# Patient Record
Sex: Female | Born: 1935 | Race: White | Hispanic: No | Smoking: Never smoker
Health system: Southern US, Community
[De-identification: ages and names within clinical notes are randomized; demographics above are authoritative.]

## PROBLEM LIST (undated history)

## (undated) DIAGNOSIS — I10 Essential (primary) hypertension: Secondary | ICD-10-CM

## (undated) DIAGNOSIS — I6529 Occlusion and stenosis of unspecified carotid artery: Secondary | ICD-10-CM

## (undated) DIAGNOSIS — F0390 Unspecified dementia without behavioral disturbance: Secondary | ICD-10-CM

## (undated) DIAGNOSIS — I739 Peripheral vascular disease, unspecified: Secondary | ICD-10-CM

## (undated) DIAGNOSIS — M1990 Unspecified osteoarthritis, unspecified site: Secondary | ICD-10-CM

## (undated) DIAGNOSIS — K219 Gastro-esophageal reflux disease without esophagitis: Secondary | ICD-10-CM

## (undated) DIAGNOSIS — N183 Chronic kidney disease, stage 3 unspecified: Secondary | ICD-10-CM

## (undated) DIAGNOSIS — E785 Hyperlipidemia, unspecified: Secondary | ICD-10-CM

## (undated) HISTORY — DX: Hyperlipidemia, unspecified: E78.5

## (undated) HISTORY — DX: Peripheral vascular disease, unspecified: I73.9

## (undated) HISTORY — DX: Unspecified osteoarthritis, unspecified site: M19.90

## (undated) HISTORY — PX: TOTAL KNEE ARTHROPLASTY: SHX125

## (undated) HISTORY — DX: Occlusion and stenosis of unspecified carotid artery: I65.29

## (undated) HISTORY — PX: KNEE SURGERY: SHX244

## (undated) HISTORY — PX: CAROTID ENDARTERECTOMY: SUR193

## (undated) HISTORY — DX: Gastro-esophageal reflux disease without esophagitis: K21.9

---

## 2001-05-24 HISTORY — PX: JOINT REPLACEMENT: SHX530

## 2001-11-14 ENCOUNTER — Encounter: Payer: Self-pay | Admitting: Orthopedic Surgery

## 2001-11-22 ENCOUNTER — Inpatient Hospital Stay (HOSPITAL_COMMUNITY): Admission: RE | Admit: 2001-11-22 | Discharge: 2001-11-27 | Payer: Self-pay | Admitting: Orthopedic Surgery

## 2001-11-22 ENCOUNTER — Encounter: Payer: Self-pay | Admitting: Orthopedic Surgery

## 2003-12-17 ENCOUNTER — Encounter: Admission: RE | Admit: 2003-12-17 | Discharge: 2003-12-17 | Payer: Self-pay | Admitting: Family Medicine

## 2004-10-21 ENCOUNTER — Encounter: Admission: RE | Admit: 2004-10-21 | Discharge: 2004-10-21 | Payer: Self-pay | Admitting: Family Medicine

## 2004-12-23 ENCOUNTER — Ambulatory Visit (HOSPITAL_COMMUNITY): Admission: RE | Admit: 2004-12-23 | Discharge: 2004-12-23 | Payer: Self-pay | Admitting: Family Medicine

## 2005-03-24 ENCOUNTER — Encounter (INDEPENDENT_AMBULATORY_CARE_PROVIDER_SITE_OTHER): Payer: Self-pay | Admitting: Specialist

## 2005-03-24 ENCOUNTER — Ambulatory Visit (HOSPITAL_COMMUNITY): Admission: RE | Admit: 2005-03-24 | Discharge: 2005-03-24 | Payer: Self-pay | Admitting: Obstetrics and Gynecology

## 2005-12-28 ENCOUNTER — Encounter: Admission: RE | Admit: 2005-12-28 | Discharge: 2005-12-28 | Payer: Self-pay | Admitting: Family Medicine

## 2007-01-02 ENCOUNTER — Encounter: Admission: RE | Admit: 2007-01-02 | Discharge: 2007-01-02 | Payer: Self-pay | Admitting: Family Medicine

## 2008-03-06 ENCOUNTER — Encounter: Admission: RE | Admit: 2008-03-06 | Discharge: 2008-03-06 | Payer: Self-pay | Admitting: Family Medicine

## 2010-08-24 ENCOUNTER — Emergency Department (HOSPITAL_COMMUNITY): Payer: Medicare Other

## 2010-08-24 ENCOUNTER — Inpatient Hospital Stay (HOSPITAL_COMMUNITY)
Admission: EM | Admit: 2010-08-24 | Discharge: 2010-08-26 | DRG: 312 | Disposition: A | Discharge status: Home / Self Care | Payer: Medicare Other | Attending: Internal Medicine | Admitting: Internal Medicine

## 2010-08-24 DIAGNOSIS — I059 Rheumatic mitral valve disease, unspecified: Secondary | ICD-10-CM | POA: Diagnosis present

## 2010-08-24 DIAGNOSIS — R55 Syncope and collapse: Principal | ICD-10-CM | POA: Diagnosis present

## 2010-08-24 DIAGNOSIS — Z791 Long term (current) use of non-steroidal anti-inflammatories (NSAID): Secondary | ICD-10-CM

## 2010-08-24 DIAGNOSIS — M810 Age-related osteoporosis without current pathological fracture: Secondary | ICD-10-CM | POA: Diagnosis present

## 2010-08-24 DIAGNOSIS — Z79899 Other long term (current) drug therapy: Secondary | ICD-10-CM

## 2010-08-24 DIAGNOSIS — E785 Hyperlipidemia, unspecified: Secondary | ICD-10-CM | POA: Diagnosis present

## 2010-08-24 DIAGNOSIS — Z96659 Presence of unspecified artificial knee joint: Secondary | ICD-10-CM

## 2010-08-24 DIAGNOSIS — Z7982 Long term (current) use of aspirin: Secondary | ICD-10-CM

## 2010-08-24 DIAGNOSIS — I6529 Occlusion and stenosis of unspecified carotid artery: Secondary | ICD-10-CM | POA: Diagnosis present

## 2010-08-24 DIAGNOSIS — I10 Essential (primary) hypertension: Secondary | ICD-10-CM | POA: Diagnosis present

## 2010-08-24 DIAGNOSIS — I658 Occlusion and stenosis of other precerebral arteries: Secondary | ICD-10-CM | POA: Diagnosis present

## 2010-08-24 HISTORY — DX: Essential (primary) hypertension: I10

## 2010-08-24 LAB — URINALYSIS, ROUTINE W REFLEX MICROSCOPIC
Glucose, UA: NEGATIVE mg/dL
Hgb urine dipstick: NEGATIVE
Ketones, ur: NEGATIVE mg/dL
Urobilinogen, UA: 0.2 mg/dL (ref 0.0–1.0)
pH: 5 (ref 5.0–8.0)

## 2010-08-24 LAB — BASIC METABOLIC PANEL
BUN: 20 mg/dL (ref 6–23)
Glucose, Bld: 127 mg/dL — ABNORMAL HIGH (ref 70–99)
Potassium: 4 mEq/L (ref 3.5–5.1)
Sodium: 138 mEq/L (ref 135–145)

## 2010-08-24 LAB — CBC
Hemoglobin: 13 g/dL (ref 12.0–15.0)
MCHC: 35.2 g/dL (ref 30.0–36.0)
RDW: 11.7 % (ref 11.5–15.5)
WBC: 9 10*3/uL (ref 4.0–10.5)

## 2010-08-24 LAB — POCT CARDIAC MARKERS
CKMB, poc: <1 ng/mL — ABNORMAL LOW (ref 1.0–8.0)
Troponin i, poc: <0.05 ng/mL (ref 0.00–0.09)

## 2010-08-24 LAB — DIFFERENTIAL
Basophils Relative: 0 % (ref 0–1)
Eosinophils Absolute: 0.3 10*3/uL (ref 0.0–0.7)
Lymphocytes Relative: 21 % (ref 12–46)
Monocytes Absolute: 0.4 10*3/uL (ref 0.1–1.0)
Neutro Abs: 6.4 10*3/uL (ref 1.7–7.7)
Neutrophils Relative %: 71 % (ref 43–77)

## 2010-08-24 LAB — URINE MICROSCOPIC-ADD ON

## 2010-08-25 ENCOUNTER — Encounter (HOSPITAL_COMMUNITY): Payer: Self-pay | Admitting: Radiology

## 2010-08-25 ENCOUNTER — Observation Stay (HOSPITAL_COMMUNITY): Payer: Medicare Other

## 2010-08-25 LAB — COMPREHENSIVE METABOLIC PANEL
ALT: 11 U/L (ref 0–35)
Albumin: 3.3 g/dL — ABNORMAL LOW (ref 3.5–5.2)
BUN: 16 mg/dL (ref 6–23)
Creatinine, Ser: 0.88 mg/dL (ref 0.4–1.2)
GFR calc Af Amer: >60 mL/min (ref >60)
GFR calc non Af Amer: >60 mL/min (ref >60)
Total Bilirubin: 0.4 mg/dL (ref 0.3–1.2)

## 2010-08-25 LAB — CBC
HCT: 36 % (ref 36.0–46.0)
RDW: 11.7 % (ref 11.5–15.5)

## 2010-08-25 LAB — CARDIAC PANEL(CRET KIN+CKTOT+MB+TROPI)
Relative Index: INVALID (ref 0.0–2.5)
Total CK: 39 U/L (ref 7–177)
Troponin I: 0.01 ng/mL (ref 0.00–0.06)
Troponin I: <0.01 ng/mL (ref 0.00–0.06)

## 2010-08-25 LAB — TSH: TSH: 1.188 u[IU]/mL (ref 0.350–4.500)

## 2010-08-25 LAB — URINE CULTURE: Colony Count: NO GROWTH

## 2010-08-25 LAB — CK TOTAL AND CKMB (NOT AT ARMC): Total CK: 42 U/L (ref 7–177)

## 2010-08-25 MED ORDER — IOHEXOL 350 MG/ML SOLN
50.0000 mL | Freq: Once | INTRAVENOUS | Status: AC | PRN
  Administered 2010-08-25: 50 mL via INTRAVENOUS

## 2010-08-26 DIAGNOSIS — I63239 Cerebral infarction due to unspecified occlusion or stenosis of unspecified carotid arteries: Secondary | ICD-10-CM

## 2010-08-26 LAB — CBC
Hemoglobin: 11.5 g/dL — ABNORMAL LOW (ref 12.0–15.0)
MCH: 30.5 pg (ref 26.0–34.0)
MCHC: 34.2 g/dL (ref 30.0–36.0)
MCV: 89.1 fL (ref 78.0–100.0)
WBC: 7 10*3/uL (ref 4.0–10.5)

## 2010-08-26 LAB — BASIC METABOLIC PANEL
BUN: 13 mg/dL (ref 6–23)
Chloride: 105 mEq/L (ref 96–112)
GFR calc non Af Amer: >60 mL/min (ref >60)
Glucose, Bld: 108 mg/dL — ABNORMAL HIGH (ref 70–99)

## 2010-08-30 NOTE — Discharge Summary (Signed)
  [name], [name]              ACCOUNT NO.:  [id number]  MEDICAL RECORD NO.:  [id number]           PATIENT TYPE:  O  LOCATION:  2020                         FACILITY:  [facility]  PHYSICIAN:  Lonia Blood, M.D.       DATE OF BIRTH:  25-Jun-1932  DATE OF ADMISSION:  08/24/2010 DATE OF DISCHARGE:  08/26/2010                              DISCHARGE SUMMARY   PRIMARY CARE PHYSICIAN:  Marjory Lies, MD  DISCHARGE DIAGNOSES: 1. Syncope of unclear etiology. 2. Severe bilateral carotid artery stenosis. 3. Hyperlipidemia. 4. Hypertension. 5. Osteoporosis. 6. History of right knee replacement.  DISCHARGE MEDICATIONS: 1. Tylenol 650 mg by mouth every 4 hours as needed for pain. 2. Fosamax 70 mg weekly. 3. Aspirin 325 mg daily. 4. Benadryl 1-2 at bedtime as needed for sleep. 5. Calcium with vitamin D a tablet twice a day. 6. Diovan/hydrochlorothiazide 320/12.5 daily. 7. Metoprolol 100 mg daily. 8. Nexium 40 mg daily. 9. Simvastatin 40 mg at bedtime.  CONDITION ON DISCHARGE:  [name] was discharged in good condition, neurologically intact.  She is scheduled to follow up on September 02, 2010, for elective carotid endarterectomy.  CONSULTATION THIS ADMISSION:  The patient was in consultation by Dr. Josephina Gip.  PROCEDURE THIS ADMISSION: 1. The patient underwent MRI of the brain without contrast showing no     acute infarct, nonspecific white matter type changes. 2. June 26, 2010, CT angiogram of the neck showing bilateral 86%     carotid artery stenosis. 3. Echocardiogram, August 25, 2010, showing an ejection fraction 55% to     60%.  No regional wall motion abnormalities, mild mitral valve     regurgitation, minimal increase in the right ventricle pressure.  HISTORY AND PHYSICAL:  Refer to dictated H and P done by Dr. Toniann Fail.  HOSPITAL COURSE:  [name] is a 77 year old woman with history of hypertension, hyperlipidemia, was admitted from the emergency room after an  episode of syncope.  Syncopal event most likely indicated a prior vasovagal event in the setting of some dehydration from over exhaustion working in the heat.  Nevertheless, she had a complete workup given her age and an MRI of the brain did not indicate presence of a stroke. Carotid ultrasound indicated that there might be significant right artery stenosis.  CT angiogram of the neck indicated 86% of bilateral carotid artery stenosis.  Given the patient's event, presence of hyperlipidemia, hypertension, we felt that this conceivable asymptomatic carotid artery stenosis needs surgical attention.  The patient was seen by Dr. Josephina Gip from Vascular Surgery and she is scheduled for carotid endarterectomy on September 02, 2010.     Lonia Blood, M.D.     SL/MEDQ  D:  08/27/2010  T:  08/28/2010  Job:  [id number]  cc:   Marjory Lies, M.D.  Electronically Signed by Lonia Blood M.D. on 08/30/2010 10:24:09 AM

## 2010-08-31 ENCOUNTER — Encounter (HOSPITAL_COMMUNITY)
Admission: RE | Admit: 2010-08-31 | Discharge: 2010-08-31 | Disposition: A | Discharge status: Home / Self Care | Payer: Medicare Other | Source: Ambulatory Visit | Attending: Vascular Surgery | Admitting: Vascular Surgery

## 2010-08-31 LAB — COMPREHENSIVE METABOLIC PANEL
ALT: 13 U/L (ref 0–35)
AST: 18 U/L (ref 0–37)
Calcium: 9.6 mg/dL (ref 8.4–10.5)
GFR calc Af Amer: >60 mL/min (ref >60)
Glucose, Bld: 97 mg/dL (ref 70–99)
Sodium: 138 mEq/L (ref 135–145)
Total Protein: 6.3 g/dL (ref 6.0–8.3)

## 2010-08-31 LAB — SURGICAL PCR SCREEN: Staphylococcus aureus: NEGATIVE

## 2010-08-31 LAB — PROTIME-INR
INR: 1.01 (ref 0.00–1.49)
Prothrombin Time: 13.5 seconds (ref 11.6–15.2)

## 2010-08-31 NOTE — Consult Note (Signed)
[name], [name]              ACCOUNT NO.:  [id number]  MEDICAL RECORD NO.:  [id number]           PATIENT TYPE:  O  LOCATION:  2020                         FACILITY:  [facility]  PHYSICIAN:  Quita Skye. Hart Rochester, M.D.  DATE OF BIRTH:  April 21, 1933  DATE OF CONSULTATION:  08/26/2010 DATE OF DISCHARGE:  08/26/2010                                CONSULTATION   CHIEF COMPLAINT:  Severe bilateral carotid occlusive disease.  HISTORY OF PRESENT ILLNESS:  This 77 year old female patient suffered a brief syncopal episode on August 24, 2010 while eating dinner with her son.  She had been out of doors much of the day and it was felt she may have been dehydrated.  She denied any hemiparesis, aphasia, amaurosis fugax, diplopia, blurred vision or other neurologic symptoms either prior to that time or at the same time and had no history of previous stroke.  Following this episode, she was evaluated at the hospital and found to be neurologically intact.  CHRONIC MEDICAL PROBLEMS: 1. Hypertension. 2. Hyperlipidemia. 3. History of arthritis with previous right total knee replacement and     left knee arthroscopy. 4. Negative for coronary artery disease, diabetes, COPD, or stroke.  SOCIAL HISTORY:  Negative for tobacco, negative for alcohol, and negative for drug use.  FAMILY HISTORY:  Positive for coronary artery disease in her mother, diabetes in her brother, and stroke in her brother.  REVIEW OF SYSTEMS:  She denies any chest pain, dyspnea on exertion. Does not ambulate long distances, uses a cane because of her knees and arthritis.  All other systems are negative with the exception of her joint discomfort in the back and the knees.  PHYSICAL EXAMINATION:  VITAL SIGNS:  Blood vessel blood pressure is 150/70, heart rate 67, and respirations 14. GENERAL:  She is an elderly female who is in no apparent distress, alert, and oriented x3. HEENT:  Normal for age.  EOMs intact. CHEST:  Clear to  auscultation.  No rhonchi or wheezing. CARDIOVASCULAR:  Regular rhythm.  No murmurs.  Carotid pulses are 3+. ABDOMEN:  Soft, nontender, and no masses. EXTREMITIES:  Lower extremity exam reveals 3+ femoral, popliteal, and dorsalis pedis pulses palpable. NEUROLOGIC:  Normal. MUSCULOSKELETAL:  Right total knee replacement.  No obvious abnormalities. SKIN:  Free of rashes.  IMAGING:  I reviewed her CT angiogram which was done during this hospitalization and also her carotid duplex exam.  CT angiogram suggests 80-90% left internal carotid stenosis and 80% right internal carotid stenosis and this is supported by her duplex scan with no evidence of stroke.  IMPRESSION:  Severe bilateral internal carotid stenoses with recent syncopal episode.  PLAN:  I believe the patient should have staged bilateral carotid endarterectomies, although I suspect that these did not cause the symptoms although this is unclear.  Plan will be to admit the patient on September 02, 2010 for an elective left carotid endarterectomy.  Risks and benefits have been thoroughly discussed.     Quita Skye Hart Rochester, M.D.     JDL/MEDQ  D:  08/26/2010  T:  08/27/2010  Job:  [id number]  Electronically Signed by Lyndee Leo.D.  on 08/31/2010 10:21:36 AM

## 2010-09-02 ENCOUNTER — Inpatient Hospital Stay (HOSPITAL_COMMUNITY)
Admission: RE | Admit: 2010-09-02 | Discharge: 2010-09-03 | DRG: 039 | Disposition: A | Discharge status: Home / Self Care | Payer: Medicare Other | Source: Ambulatory Visit | Attending: Vascular Surgery | Admitting: Vascular Surgery

## 2010-09-02 ENCOUNTER — Other Ambulatory Visit: Payer: Self-pay | Admitting: Vascular Surgery

## 2010-09-02 DIAGNOSIS — M1710 Unilateral primary osteoarthritis, unspecified knee: Secondary | ICD-10-CM | POA: Diagnosis present

## 2010-09-02 DIAGNOSIS — I6529 Occlusion and stenosis of unspecified carotid artery: Principal | ICD-10-CM | POA: Diagnosis present

## 2010-09-02 DIAGNOSIS — Z96659 Presence of unspecified artificial knee joint: Secondary | ICD-10-CM

## 2010-09-02 DIAGNOSIS — Z01810 Encounter for preprocedural cardiovascular examination: Secondary | ICD-10-CM

## 2010-09-02 DIAGNOSIS — Z01812 Encounter for preprocedural laboratory examination: Secondary | ICD-10-CM

## 2010-09-02 DIAGNOSIS — IMO0002 Reserved for concepts with insufficient information to code with codable children: Secondary | ICD-10-CM | POA: Diagnosis present

## 2010-09-02 DIAGNOSIS — I10 Essential (primary) hypertension: Secondary | ICD-10-CM | POA: Diagnosis present

## 2010-09-02 DIAGNOSIS — E785 Hyperlipidemia, unspecified: Secondary | ICD-10-CM | POA: Diagnosis present

## 2010-09-02 LAB — CBC
MCH: 31.3 pg (ref 26.0–34.0)
MCHC: 35.1 g/dL (ref 30.0–36.0)
MCV: 89.3 fL (ref 78.0–100.0)
Platelets: 219 10*3/uL (ref 150–400)
RDW: 11.8 % (ref 11.5–15.5)
WBC: 8.3 10*3/uL (ref 4.0–10.5)

## 2010-09-02 LAB — URINALYSIS, ROUTINE W REFLEX MICROSCOPIC
Bilirubin Urine: NEGATIVE
Ketones, ur: NEGATIVE mg/dL
Nitrite: NEGATIVE
Protein, ur: NEGATIVE mg/dL
Specific Gravity, Urine: 1.022 (ref 1.005–1.030)
Urobilinogen, UA: 0.2 mg/dL (ref 0.0–1.0)

## 2010-09-03 LAB — CBC
HCT: 33 % — ABNORMAL LOW (ref 36.0–46.0)
Hemoglobin: 11.3 g/dL — ABNORMAL LOW (ref 12.0–15.0)
MCH: 30.8 pg (ref 26.0–34.0)
MCV: 89.9 fL (ref 78.0–100.0)
RBC: 3.67 MIL/uL — ABNORMAL LOW (ref 3.87–5.11)
WBC: 8.8 10*3/uL (ref 4.0–10.5)

## 2010-09-03 LAB — BASIC METABOLIC PANEL
BUN: 10 mg/dL (ref 6–23)
CO2: 29 mEq/L (ref 19–32)
Chloride: 101 mEq/L (ref 96–112)
Creatinine, Ser: 0.75 mg/dL (ref 0.4–1.2)
Potassium: 3.6 mEq/L (ref 3.5–5.1)

## 2010-09-03 LAB — GLUCOSE, CAPILLARY: Glucose-Capillary: 134 mg/dL — ABNORMAL HIGH (ref 70–99)

## 2010-09-04 LAB — TYPE AND SCREEN
ABO/RH(D): A POS
Unit division: 0

## 2010-09-08 NOTE — Discharge Summary (Signed)
NAMEINDRA, WOLTERS              ACCOUNT NO.:  [id number]  MEDICAL RECORD NO.:  [id number]           PATIENT TYPE:  I  LOCATION:  3301                         FACILITY:  [facility]  PHYSICIAN:  Quita Skye. Hart Rochester, M.D.  DATE OF BIRTH:  January 09, 1933  DATE OF ADMISSION:  09/02/2010 DATE OF DISCHARGE:  09/03/2010                              DISCHARGE SUMMARY   FINAL DIAGNOSIS:  Left internal carotid artery stenosis.  PAST MEDICAL HISTORY AND DISCHARGE DIAGNOSES: 1. Hypertension. 2. Hyperlipidemia. 3. Arthritis with previous right total knee replacement and left knee     arthroscopy. 4. Left internal carotid artery stenosis status post left carotid     endarterectomy.  BRIEF HISTORY:  The patient is a 77 year old female who was admitted on August 24, 2010, after suffering a presyncopal episode at home.  Workup included a CT angiogram which revealed a left internal carotid artery stenosis of 80% to 90% and a right internal carotid artery stenosis of 80%.  Dr. Hart Rochester was consulted regarding these results and he felt that the patient should undergo staged bilateral carotid endarterectomies for stroke risk reduction.  HOSPITAL COURSE:  The patient was admitted and taken to the OR on September 02, 2010 for a left carotid endarterectomy with Dacron patch angioplasty.  The patient tolerated the procedure well and was hemodynamically stable immediately postoperatively.  She was transferred from the OR to the Post Anesthesia Care Unit in stable condition.  She was extubated without complication and woke up from anesthesia neurologically intact.  The patient's postoperative course progressed as expected.  On postoperative day #1, she was without complaint.  She was voiding and ambulating without difficulty.  She was also tolerating a regular diet.  PHYSICAL EXAMINATION:  CARDIAC:  Regular rate and rhythm. LUNGS:  Clear to auscultation. ABDOMEN:  Benign.  INCISION: clean, dry, and intact with  no evidence of hematoma. NEUROLOGIC:  She is neurologically intact.  The patient is doing well status post carotid endarterectomy and is felt stable for discharge home at this time.  LABORATORY DATA:  CBC and BMP on September 03, 2010, white count 8.8, hemoglobin 11.3, hematocrit 33, platelets 165.  Sodium 136, potassium 3.6, BUN 10, creatinine 0.75.  DISCHARGE INSTRUCTIONS:  The patient received specific written discharge instructions regarding diet, activity, and wound care.  She will follow up with Dr. Hart Rochester in approximately 2 weeks after discharge.  She will be contacted with the office with the date and time of that appointment.  DISCHARGE MEDICATIONS: 1. Oxycodone 5 mg 1-2 q.4-6 h. p.r.n. pain. 2. Alendronate 70 mg 1 tablet p.o. q.week. 3. Aspirin 325 mg 1 p.o. daily. 4. Acetaminophen 325 mg 2 tablets p.o. q.4 h. p.r.n. pain. 5. Benadryl 25 mg 1-2 at bedtime p.r.n. 6. Calcium carbonate OTC 1 tablet p.o. b.i.d. 7. Diovan/HCT 320/12.5 mg one p.o. daily. 8. Metoprolol XL 100 mg p.o. daily. 9. Nexium 40 mg daily. 10.Simvastatin 40 mg at bedtime.     Pecola Leisure, PA   ______________________________ Quita Skye Hart Rochester, M.D.    AY/MEDQ  D:  09/03/2010  T:  09/04/2010  Job:  [id number]  Electronically Signed by Marchelle Folks  [facility] PA on 09/08/2010 10:49:44 AM Electronically Signed by Josephina Gip M.D. on 09/08/2010 11:51:04 AM

## 2010-09-08 NOTE — Op Note (Signed)
NAMEADEJA, SARRATT              ACCOUNT NO.:  [id number]  MEDICAL RECORD NO.:  [id number]          PATIENT TYPE:  LOCATION:                                 FACILITY:  PHYSICIAN:  Quita Skye. Hart Rochester, M.D.       DATE OF BIRTH:  DATE OF PROCEDURE:  09/02/2010 DATE OF DISCHARGE:                              OPERATIVE REPORT   PREOPERATIVE DIAGNOSIS:  Bilateral significant carotid occlusive disease, left greater than right with recent syncopal episode.  POSTOPERATIVE DIAGNOSIS:  Bilateral significant carotid occlusive disease, left greater than right with recent syncopal episode.  OPERATIONS:  Left carotid endarterectomy with Dacron patch angioplasty.  SURGEON:  Quita Skye. Hart Rochester, MD  FIRST ASSISTANT:  Janetta Hora. Fields, MD  SECOND ASSISTANT:  Lujean Rave PA  ANESTHESIA:  General endotracheal.  BRIEF HISTORY:  This 77 year old female had a near syncopal episode while eating lunch with her son a few weeks ago.  She then became totally neurologically intact and had no evidence of stroke.  Workup included carotid duplex and CT angiography which revealed significant bilateral internal carotid stenoses, left greater than right, both in the 89% range.  She was scheduled for staged bilateral carotid endarterectomies.  PROCEDURE:  The patient was taken to the operating room, placed in the supine position at which time satisfactory general endotracheal anesthesia was administered.  The left neck was prepped with Betadine scrub solution and draped in a routine sterile manner.  Incision was made along the anterior border of the sternocleidomastoid muscle and carried down through the subcutaneous tissue and platysma using Bovie. Common facial vein and external jugular veins were ligated with 3-0 silk ties and divided exposing the common internal and external carotid arteries.  Care was taken not to injure the vagus or hypoglossal nerves both which were exposed.  There was calcified  atherosclerotic plaque at the carotid bifurcation extending up the internal carotid about 3 cm distal vessel appeared normal.  A #10 shunt was prepared and the patient was heparinized.  Carotid vessels were occluded with vascular clamps. Longitudinal opening made in the common carotid with a 15-blade extended up into the internal carotid with Potts scissors to a point distal to the disease.  Plaque was about 80-90% stenotic in severity, distal vessel appeared normal.  A #10 shunt was inserted without difficulty reestablishing flow in about 2 minutes.  Standard endarterectomy was then performed using elevator and Potts scissors with eversion endarterectomy of the external carotid.  The plaque feathered off the distal internal carotid artery nicely not requiring any tacking sutures. Lumen was thoroughly irrigated with heparin saline.  All loose debris carefully removed, arteriotomy was closed with a patch using continuous 6-0 Prolene.  Prior to completion of the closure, the shunt was removed after about 30 minutes of shunt time following antegrade and retrograde flushing, closure was completed through reestablishing of flow initially up the external and up the internal branch.  Carotid was occluded for less than 2 minutes for removal of the shunt.  Protamine was then given to reverse the heparin, following adequate hemostasis, wound was irrigated with saline, closed in layers with Vicryl in  subcuticular fashion.  Sterile dressing applied.  The patient was taken to the recovery room in satisfactory condition.     Quita Skye Hart Rochester, M.D.     JDL/MEDQ  D:  09/02/2010  T:  09/03/2010  Job:  [id number]  Electronically Signed by Josephina Gip M.D. on 09/08/2010 11:51:01 AM

## 2010-09-15 ENCOUNTER — Ambulatory Visit (INDEPENDENT_AMBULATORY_CARE_PROVIDER_SITE_OTHER): Payer: Medicare Other | Admitting: Vascular Surgery

## 2010-09-15 DIAGNOSIS — I6529 Occlusion and stenosis of unspecified carotid artery: Secondary | ICD-10-CM

## 2010-09-16 NOTE — Assessment & Plan Note (Signed)
OFFICE VISIT  JERUSALEM, BROWNSTEIN DOB:  06-Jul-1932                                       09/15/2010 ZOXWR#:[id number]  Patient returns for initial follow-up regarding her left carotid endarterectomy, which I performed April 11 for severe left internal carotid stenosis which was preceded by a syncopal episode.  She has done well since her surgery with no neurologic complications.  She denies any hemiparesis, aphasia, amaurosis fugax, diplopia, blurred vision, or other syncopal episodes.  She is swallowing well and has no hoarseness. She is taking 1 aspirin a day.  On physical exam today, blood pressure 152/61, heart rate 58, respirations 24.  Her left neck incision has healed nicely.  She has a 3+ carotid pulse with no audible bruit on the left.  There is a soft bruit on the right side.  Neurologic exam reveals a mild left marginal mandibular nerve paresis, otherwise normal.  I think she has done very well following her left carotid surgery.  We will schedule her right carotid endarterectomy for Thursday May 17 at [facility].  She has an approximate 80% right internal carotid stenosis, which is asymptomatic.  She is ready to proceed with that in the near future.    Quita Skye Hart Rochester, M.D. Electronically Signed  JDL/MEDQ  D:  09/15/2010  T:  09/16/2010  Job:  [id number]

## 2010-09-30 ENCOUNTER — Encounter (HOSPITAL_COMMUNITY)
Admission: RE | Admit: 2010-09-30 | Discharge: 2010-09-30 | Disposition: A | Discharge status: Home / Self Care | Payer: Medicare Other | Source: Ambulatory Visit | Attending: Vascular Surgery | Admitting: Vascular Surgery

## 2010-09-30 ENCOUNTER — Ambulatory Visit (HOSPITAL_COMMUNITY)
Admission: RE | Admit: 2010-09-30 | Discharge: 2010-09-30 | Disposition: A | Discharge status: Home / Self Care | Payer: Medicare Other | Source: Ambulatory Visit | Attending: Vascular Surgery | Admitting: Vascular Surgery

## 2010-09-30 ENCOUNTER — Other Ambulatory Visit: Payer: Self-pay | Admitting: Vascular Surgery

## 2010-09-30 DIAGNOSIS — Z01818 Encounter for other preprocedural examination: Secondary | ICD-10-CM | POA: Insufficient documentation

## 2010-09-30 DIAGNOSIS — J4489 Other specified chronic obstructive pulmonary disease: Secondary | ICD-10-CM | POA: Insufficient documentation

## 2010-09-30 DIAGNOSIS — I6529 Occlusion and stenosis of unspecified carotid artery: Secondary | ICD-10-CM

## 2010-09-30 DIAGNOSIS — J449 Chronic obstructive pulmonary disease, unspecified: Secondary | ICD-10-CM | POA: Insufficient documentation

## 2010-09-30 DIAGNOSIS — Z01812 Encounter for preprocedural laboratory examination: Secondary | ICD-10-CM | POA: Insufficient documentation

## 2010-09-30 DIAGNOSIS — Z01810 Encounter for preprocedural cardiovascular examination: Secondary | ICD-10-CM | POA: Insufficient documentation

## 2010-09-30 DIAGNOSIS — I10 Essential (primary) hypertension: Secondary | ICD-10-CM | POA: Insufficient documentation

## 2010-09-30 LAB — COMPREHENSIVE METABOLIC PANEL
ALT: 13 U/L (ref 0–35)
Alkaline Phosphatase: 62 U/L (ref 39–117)
BUN: 17 mg/dL (ref 6–23)
CO2: 32 mEq/L (ref 19–32)
Chloride: 99 mEq/L (ref 96–112)
GFR calc non Af Amer: >60 mL/min (ref >60)
Glucose, Bld: 107 mg/dL — ABNORMAL HIGH (ref 70–99)
Potassium: 4.1 mEq/L (ref 3.5–5.1)
Sodium: 139 mEq/L (ref 135–145)
Total Bilirubin: 0.2 mg/dL — ABNORMAL LOW (ref 0.3–1.2)

## 2010-09-30 LAB — URINALYSIS, ROUTINE W REFLEX MICROSCOPIC
Glucose, UA: NEGATIVE mg/dL
Ketones, ur: NEGATIVE mg/dL
Nitrite: NEGATIVE
Protein, ur: NEGATIVE mg/dL

## 2010-09-30 LAB — SURGICAL PCR SCREEN
MRSA, PCR: NEGATIVE
Staphylococcus aureus: NEGATIVE

## 2010-09-30 LAB — PROTIME-INR: Prothrombin Time: 12.9 seconds (ref 11.6–15.2)

## 2010-09-30 LAB — CBC
Hemoglobin: 12.9 g/dL (ref 12.0–15.0)
MCH: 31 pg (ref 26.0–34.0)
MCV: 89.7 fL (ref 78.0–100.0)
Platelets: 163 10*3/uL (ref 150–400)
RBC: 4.16 MIL/uL (ref 3.87–5.11)
WBC: 8.3 10*3/uL (ref 4.0–10.5)

## 2010-09-30 LAB — URINE MICROSCOPIC-ADD ON

## 2010-09-30 LAB — APTT: aPTT: 27 seconds (ref 24–37)

## 2010-10-03 NOTE — H&P (Signed)
NAMELYANNE, KATES              ACCOUNT NO.:  [id number]  MEDICAL RECORD NO.:  [id number]           PATIENT TYPE:  E  LOCATION:  [facility]                         FACILITY:  [facility]  PHYSICIAN:  Eduard Clos, MDDATE OF BIRTH:  1932/07/22  DATE OF ADMISSION:  08/24/2010 DATE OF DISCHARGE:                             HISTORY & PHYSICAL   PRIMARY CARE PHYSICIAN:  Dr. Marjory Lies.  CHIEF COMPLAINT:  Loss of consciousness.  HISTORY OF PRESENT ILLNESS:  A 77 year old female with known history of hypertension and osteoporosis, hyperlipidemia, has been feeling weak for day or two and today while having dinner in front of her family, she suddenly fell unconscious and has generalized jerky movements.  She did not have any tongue bite or any incontinent of urine.  She became all right completely back to normal.  She has gone to ER.  In the ER, the patient had a CT head.  Labs were looking nothing acute.  The patient has been admitted for observation.  The patient denies any headache or any visual symptoms.  Denies any focal deficit.  Denies any chest pain, shortness of breath, nausea or vomiting, abdominal pain, dysuria, discharge or diarrhea.  PAST MEDICAL HISTORY:  Hypertension, hyperlipidemia, osteoporosis.  PAST SURGICAL HISTORY:  Right knee replacement.  MEDICATIONS ON ADMISSION:  Alendronate, aspirin, Diovan, ibuprofen, metoprolol, Nexium, simvastatin, doses of which has to be confirmed.  ALLERGIES:  No known drug allergies.  FAMILY HISTORY:  Significant for stroke and heart attack.  Her dad died at age 72 from heart attack.  Brother had pancreatic cancer and lung cancer.  SOCIAL HISTORY:  The patient denies smoking cigarettes, drinking alcohol or use of illegal drugs.  REVIEW OF SYSTEMS:  As per history of present illness, nothing else significant.  PHYSICAL EXAMINATION:  GENERAL:  The patient is examined at bedside not in acute distress. VITAL SIGNS:  Blood  pressure 140/78, pulse is 70 per minute, temperature 97.4, respirations 18, O2 sat 96%. HEENT/TONGUE:  Anicteric.  No pallor.  No facial asymmetry.  Tongue is midline.  No nuchal rigidity. CHEST:  Bilateral air entry is present.  No rhonchi.  No crepitation. HEART:  S1 and S2 heard. ABDOMEN:  Soft, nontender.  Bowel sounds heard. CNS: The patient is alert, awake and oriented to time, place, and person.  Moves upper and lower extremities 5/5.  There is no pronator drift.  No dysdiadochokinesia or ataxia. EXTREMITIES:  Peripheral pulses felt.  No edema.  LABORATORY FINDINGS:  EKG has been ordered.  CT of the head without contrast shows no evidence for acute intracranial hemorrhage, mass lesion or acute infarct changes of mild small vessel ischemia with a deep white matter.  Chest x-ray shows cardiomegaly, low lung volumes, and probable chronic interstitial lung disease.  CBC; WBC is 9, hemoglobin is 13, hematocrit is 36.9, platelets 193.  Basic metabolic panel; sodium 138, potassium 4, chloride 100, carbon dioxide 28, glucose 127, BUN 20, creatinine 1.1, calcium 9.5, CK-MB less than 1, troponin less than 0.05, myoglobin 103.  UA shows moderate leukocytes, squamous cells many, wbcs 11-20, hyaline casts, bacteria few.  ASSESSMENT: 1. Syncope.  2. History of hypertension. 3. History of hyperlipidemia. 4. History of hyperthyroidism. 5. Possible urinary tract infection.  PLAN: 1. At this time we will admit the patient to telemetry, rule out any     arrhythmias. 2. For syncopal episode, we will get an MRI of the brain, 2-D echo and     carotid Doppler. We will cycle cardiac markers, gently hydrate, get     orthostatics in a.m. 3. For possible UTI, at this time we will get urine culture, until     then I am going to place the patient on ceftriaxone. 4. Further recommendation as condition evolves.     Eduard Clos, MD     ANK/MEDQ  D:  08/25/2010  T:  08/25/2010  Job:   [id number]  cc:   Marjory Lies, M.D.  Electronically Signed by Midge Minium MD on 10/03/2010 07:46:21 PM

## 2010-10-08 ENCOUNTER — Inpatient Hospital Stay (HOSPITAL_COMMUNITY)
Admission: RE | Admit: 2010-10-08 | Discharge: 2010-10-09 | DRG: 039 | Disposition: A | Discharge status: Home / Self Care | Payer: Medicare Other | Source: Ambulatory Visit | Attending: Vascular Surgery | Admitting: Vascular Surgery

## 2010-10-08 ENCOUNTER — Other Ambulatory Visit: Payer: Self-pay | Admitting: Vascular Surgery

## 2010-10-08 DIAGNOSIS — K219 Gastro-esophageal reflux disease without esophagitis: Secondary | ICD-10-CM | POA: Diagnosis present

## 2010-10-08 DIAGNOSIS — E669 Obesity, unspecified: Secondary | ICD-10-CM | POA: Diagnosis present

## 2010-10-08 DIAGNOSIS — I6529 Occlusion and stenosis of unspecified carotid artery: Principal | ICD-10-CM | POA: Diagnosis present

## 2010-10-08 DIAGNOSIS — I10 Essential (primary) hypertension: Secondary | ICD-10-CM | POA: Diagnosis present

## 2010-10-08 DIAGNOSIS — Z01818 Encounter for other preprocedural examination: Secondary | ICD-10-CM

## 2010-10-08 DIAGNOSIS — Z96659 Presence of unspecified artificial knee joint: Secondary | ICD-10-CM

## 2010-10-08 DIAGNOSIS — Z01812 Encounter for preprocedural laboratory examination: Secondary | ICD-10-CM

## 2010-10-08 DIAGNOSIS — E785 Hyperlipidemia, unspecified: Secondary | ICD-10-CM | POA: Diagnosis present

## 2010-10-08 DIAGNOSIS — Z01810 Encounter for preprocedural cardiovascular examination: Secondary | ICD-10-CM

## 2010-10-08 LAB — TYPE AND SCREEN

## 2010-10-09 LAB — BASIC METABOLIC PANEL
BUN: 11 mg/dL (ref 6–23)
CO2: 27 mEq/L (ref 19–32)
Calcium: 8.6 mg/dL (ref 8.4–10.5)
Chloride: 102 mEq/L (ref 96–112)
Creatinine, Ser: 0.7 mg/dL (ref 0.4–1.2)
GFR calc Af Amer: >60 mL/min (ref >60)
Glucose, Bld: 136 mg/dL — ABNORMAL HIGH (ref 70–99)

## 2010-10-09 LAB — CBC
HCT: 31.2 % — ABNORMAL LOW (ref 36.0–46.0)
Hemoglobin: 10.6 g/dL — ABNORMAL LOW (ref 12.0–15.0)
MCH: 30.5 pg (ref 26.0–34.0)
MCHC: 34 g/dL (ref 30.0–36.0)
MCV: 89.9 fL (ref 78.0–100.0)
RBC: 3.47 MIL/uL — ABNORMAL LOW (ref 3.87–5.11)

## 2010-10-09 NOTE — Discharge Summary (Signed)
[facility]  Patient:    [name], [name] Visit Number: [id number] MRN: [id number]          Service Type: SUR Location: 4W [id number] 02 Attending Physician:  Skip Mayer Dictated by:   Clarene Reamer, P.A.-C Admit Date:  11/22/2001 Discharge Date: 11/27/2001                             Discharge Summary  ADMISSION DIAGNOSES: 1. Joint pain, right leg. 2. Osteoarthritis, right leg. 3. Knee deformity. 4. Hypertension. 5. Esophageal reflux. 6. Anemia.  DISCHARGE DIAGNOSES: 1. Joint pain, right leg. 2. Osteoarthritis, right leg. 3. Status post right total knee replacement. 4. Knee deformity. 5. Hypertension. 6. Esophageal reflux. 7. Anemia.  PROCEDURE:  The patient was taken to the operating room on 11/22/01, and underwent a right total knee replacement.  Surgeon was Dr. Ranee Gosselin. Surgery was done under general anesthesia.  Hemovac drain x1 was placed at the time of surgery.  CONSULTATIONS: 1. Rehabilitation. 2. Physical therapy. 3. Occupational therapy.  HISTORY OF PRESENT ILLNESS:  The patient is a 77 year old with long-standing history of right knee pain.  Her pain has progressively gotten worse through the years, and she has also developed a deformity of her right knee.  She states that she has gotten to the point where she is unable to perform activities of daily living.  She also goes on to state that her pain keeps her up at night.  She has elected to proceed with right total knee replacement.  LABORATORY DATA:  CBC on admission was all within normal limits.  Serial hemoglobins and hematocrits were followed throughout hospital course. Hemoglobin and hematocrit did decline to 8.4 and 24.2 on 11/26/01, at the time of discharge, however, it had risen back up to 9.1 and 25.9, respectively. Differential on admission was all within normal limits.  Coagulation studies on admission were all within normal limits.  At the time of  discharge, PT was 21.8, INR was 2.2 on Coumadin therapy.  Routine chemistries on admission was all within normal limits except a glucose was slightly high at 121. Urinalysis on admission showed a moderate amount of hemoglobin, moderate leukocyte esterase, and a few epithelial cells.  The patient has a blood type of A+ with antibody screen negative.  EKG showed normal sinus rhythm.  Preoperative films on 11/14/01, of the right knee showed impression of degenerative changes, no acute abnormality, flexion deformity of the right lower extremity, advanced degenerative disease in the medial compartment with lesser degenerative changes of the patellofemoral and lateral compartments.  Postoperative film on 11/22/01, revealed status post right total knee replacement, no acute abnormality.  HOSPITAL COURSE:  The patient was admitted to [facility] and taken to the OR.  She underwent the above stated procedure without any complications.  The patient tolerated the procedure well, was allowed to return to the recovery room and then to the orthopedic floor for continued postoperative care.  Hemovac drain was placed at the time of surgery, was pulled on postoperative day #1.  She was placed on PCA analgesia for pain control.  PCA was discontinued on postoperative day #2, when she weaned over to p.o. analgesics.  On postoperative day #1, the patient was doing well, had no complaints.  On postoperative day #2, 11/24/01, had no complaints.  Initial physical therapy progress.  On postoperative day #3, 11/25/01, she was doing well.  Pain was controlled, she had  no complaints.  On postoperative day #4, 11/26/01, the patient was doing well, pain was well controlled, ambulated 120 feet.  On 11/27/01, postoperative day #5, the patient was doing well in the a.m., had no complaints, she was ready to be discharged home.  DISPOSITION:  The patient was discharged home on 11/27/01.  DISCHARGE MEDICATIONS: 1. Coumadin  5 mg per pharmacy protocol. 2. Percocet 5/325 mg one or two p.o. q.4-6h. p.r.n. 3. Robaxin 500 mg one p.o. q.8h. p.r.n. 4. Trinsicon caplets one p.o. b.i.d.  DIET:  As tolerated.  ACTIVITY:  Hip precautions.  [facility] for home care.  Weightbearing as tolerated.  FOLLOWUP:  The patient was to follow up with Dr. Darrelyn Hillock two weeks from date of surgery.  Call [phone] for an appointment.  CONDITION ON DISCHARGE:  Stable and improved. Dictated by:   Clarene Reamer, P.A.-C Attending Physician:  Skip Mayer DD:  12/06/01 TD:  12/10/01 Job: [id number]

## 2010-10-09 NOTE — Op Note (Signed)
[facility]  Patient:    KIA, VARNADORE Visit Number: [id number] MRN: [id number]          Service Type: SUR Location: [id number] Attending Physician:  Skip Mayer Dictated by:   Georges Lynch Darrelyn Hillock, M.D. Proc. Date: 11/22/01 Admit Date:  11/22/2001                             Operative Report  PREOPERATIVE DIAGNOSIS:  Severe degenerative arthritis with a varus deformity of the right knee.  POSTOPERATIVE DIAGNOSIS:  Severe degenerative arthritis with a varus deformity of the right knee.  OPERATION:  Right total knee arthroplasty, utilizing the Osteonics System. All three components were cemented. I utilized Vancomycin cement, 2 grams of Vancomycin were mixed with cement.  The tibial tray was a size 7.  The insert was a 10 mm thickness insert.  The patella was a size 26 and the femoral component was a size 7, right.  SURGEON:  Georges Lynch. Gioffre, M.D.  ASSISTANT:  Philips J. Montez Morita, M.D.  DESCRIPTION OF PROCEDURE:  Under spinal anesthesia, routine orthopedic prepping and draping of the right lower extremity was carried out.  Incision was made down the anterior aspect of the right knee.  Bleeders were identified and cauterized.  Two flaps were created and sutured in place.  I then carried out a median parapatellar incision.  The patella was reflexed laterally.  I flexed the knee and did excise the medial and lateral menisci.  I also excised the anterior and posterior cruciate ligaments.  Following this, I went ahead and removed all the spurs.  My initial drill hole was made in the intercondylar notch of the femur.  The femoral one jig was inserted.  I removed 12 mm thickness off the distal femur because of the flexion contracture.  Following this, I then inserted my #2 jig for a size 7 right femur and cut my anterior, posterior and chamfer cuts.  After this was completed, I then cut my tibia.  I removed 2 mm thickness off the  affected medial side of the tibia, utilizing the intramedullary guide rods.  We utilized a size 7 tray.  Following this, I went and cut my intercondylar notch cuts and my patellar groove cuts in the usual fashion.  I then went through trials with a size 7 right femur and a size 7 tray with a 10 mm thickness insert and I had excellent stability.  I thoroughly irrigated out the area.  I then cut my patella.  I removed 10 mm thickness off the articular surface of the patella.  Three drill holes were made in the patella for a size 26 patella.  I then flexed the knee and cut my keel cut out of the proximal tibial plateau.  Thoroughly water piked the knee out.  Dried the knee out and then inserted Gelfoam into the distal femur and also into the proximal tibia. Following this, I then went on and cemented all three components in simultaneously.  The size 26 patella was used.  A size 7 right femur was used and a size 7 tibial tray with a 10 mm thickness insert.  All three components were cemented.  I removed all loose pieces of cement.  I then removed my trial tibial insert and searched for cement posteriorly.  Removed all of the loose pieces of cement. Thoroughly irrigated out the area.  Dried the area out. I bone  waxed the raw bleeding bone ends.  I then inserted my 10 mm thickness tibial insert and then a size 7 tray.  I reduced the knee and had excellent function.  I then inserted my Hemovac drain and closed the knee in layers in the usual fashion.  Sterile Neosporin dressing was applied.  The patient had 1 gram IV Ancef preoperatively. Dictated by:   Georges Lynch Darrelyn Hillock, M.D. Attending Physician:  Skip Mayer DD:  11/22/01 TD:  11/25/01 Job: [id number]

## 2010-10-09 NOTE — Op Note (Signed)
[name], STREET              ACCOUNT NO.:  [id number]   MEDICAL RECORD NO.:  [id number]          PATIENT TYPE:  AMB   LOCATION:  SDC                           FACILITY:  [facility]   PHYSICIAN:  Richardean Sale, M.D.   DATE OF BIRTH:  Jun 12, 1932   DATE OF PROCEDURE:  03/24/2005  DATE OF DISCHARGE:                                 OPERATIVE REPORT   PREOPERATIVE DIAGNOSES:  1.  Postmenopausal bleeding.  2.  Endometrial fluid collection on ultrasound.   POSTOPERATIVE DIAGNOSES:  1.  Postmenopausal bleeding.  2.  Endometrial fluid collection on ultrasound.  3.  Pyometra.  4.  Stenotic cervix.   PROCEDURE:  Fractional dilatation and curettage.   SURGEON.:  Richardean Sale, M.D.   ASSISTANT:  None.   ANESTHESIA:  General.   COMPLICATIONS:  None.   ESTIMATED BLOOD LOSS:  Minimal.   SPECIMEN:  Endocervical and endometrial curettings to pathology and aerobic  and anaerobic cultures.   FINDINGS:  Stenotic cervix and purulent drainage from the uterus once the  cervix was opened, and uterine sound length of 7 cm.   INDICATIONS:  This is a 77 year old white female who had an episode of  postmenopausal bleeding and abnormal vaginal discharge, who underwent  ultrasound evaluation and was found to have a normal-appearing endometrial  stripe but a fluid collection within the uterus.  An attempt was made at  office endometrial biopsy but was unsuccessful given significant cervical  stenosis.  Therefore, the patient was taken to the operating room for  evaluation of postmenopausal bleeding and endometrial fluid.  Prior to the  procedure the risks, benefits and alternatives of the procedure were  reviewed with the patient detail.  We discussed the risks, which include but  are not limited to hemorrhage, infection, injury to the uterus which could  require surgery such as hysterectomy or repair of bowel or bladder should an  injury occur.  Also reviewed the risks of DVT and general  anesthetic-related  complications.  The patient voiced understanding of these risks, and  informed consent was obtained was obtained before proceeding to the OR.   INDICATIONS:  PROCEDURE:  The patient was taken to the operating room, where she was given a general  anesthetic.  She was then prepped and draped in the usual sterile fashion  with Betadine and a red rubber catheter was then used to drain the bladder.  Bimanual exam confirmed the presence of a small uterus, anteverted and  mobile, with no obvious masses.  Adnexa were not palpable.  On speculum exam  there was significant atrophy of the vagina and a large cystocele present.  Using a Sims retractor and the posterior weighted speculum, the cervix was  able to be visualized and was grasped with a single-tooth tenaculum.  There  was significant stenosis, and the cervix was essentially flush with the  vaginal walls.  Using the lacrimal duct dilators, the cervical os was  identified and with very gentle pressure was entered, and there was purulent  fluid on return.  The cervix was then very easily dilated.  The uterus  sounded  to 7 cm.  An endocervical curettage and an endometrial curettage was  obtained and sent to pathology.  Given the purulent material, hysteroscopy  was not performed.  After the curettage, there was no further purulent  drainage.  The single-tooth tenaculum was removed and the tenaculum site was  cauterized with silver nitrate.  All instruments were then removed from the  patient's vagina.  She was taken out of the dorsal lithotomy position and  was awakened from anesthesia and was taken to the recovery room awake and in  stable condition.  There were no complications.  All sponge, lap, needle and  instrument counts were correct x2.      Richardean Sale, M.D.  Electronically Signed     JW/MEDQ  D:  03/24/2005  T:  03/24/2005  Job:  [id number]

## 2010-10-12 NOTE — Op Note (Signed)
[name], [name]              ACCOUNT NO.:  [id number]  MEDICAL RECORD NO.:  [id number]           PATIENT TYPE:  I  LOCATION:  3310                         FACILITY:  [facility]  PHYSICIAN:  Quita Skye. Hart Rochester, M.D.  DATE OF BIRTH:  1932-09-22  DATE OF PROCEDURE:  10/08/2010 DATE OF DISCHARGE:                              OPERATIVE REPORT   PREOPERATIVE DIAGNOSIS:  Severe right internal carotid stenosis - asymptomatic.  POSTOPERATIVE DIAGNOSIS:  Severe right internal carotid stenosis - asymptomatic.  OPERATION:  Right carotid endarterectomy with Dacron patch angioplasty.  SURGEON:  Quita Skye. Hart Rochester, MD  FIRST ASSISTANT:  Newton Pigg, PA  ANESTHESIA:  General endotracheal.  BRIEF HISTORY:  This patient has previously undergone left carotid endarterectomy for a severe asymptomatic stenosis, now returns to have her right side done which also has a greater than 80% stenosis.  She remains asymptomatic.  PROCEDURE IN DETAILS:  The patient was taken to the operating room, placed in the supine position, at which time satisfactory general endotracheal anesthesia was administered.  The right neck was prepped with Betadine scrub and solution, draped in routine sterile manner. Incision was made along the anterior border of the sternocleidomastoid muscle and carried down through subcutaneous tissue and platysma using the Bovie.  The common facial vein and external jugular veins ligated with 3-0 silk ties and divided exposing the common internal and external carotid arteries.  Care was taken not to injure the vagus or hypoglossal nerves both of which were exposed.  There was calcified atherosclerotic plaque at the carotid bifurcation extending up the internal carotid artery only about 2-3 cm.  Distal vessel appeared normal.  A #10 shunt was prepared and the patient was heparinized.  Carotid vessels occluded with vascular clamps, longitudinal opening made in the common carotid with  15 blade, extended up the internal carotid with Potts scissors to a point distal to the disease.  The distal vessel appeared normal with excellent backbleeding.  A #10 shunt was inserted without difficulty reestablishing flow in about 2 minutes.  The plaque was at least 80% stenotic in severity, very calcified.  Standard endarterectomy was then performed using the elevator and Potts scissors with an eversion endarterectomy of the external carotid.  The plaque feathered off the distal internal carotid artery nicely not requiring any tacking sutures. Lumen was thoroughly irrigated with heparin saline.  All loose debris carefully removed and arteriotomy was then closed with a Dacron patch using continuous 6-0 Prolene.  Prior to completion of the closure, shunt was removed after about 30 minutes shunt time following antegrade and retrograde flushing.  Closure was completed, reestablishing flow initially up the external and internal branch.  Carotid was occluded for less than 2 minutes for removal of the shunt. Protamine was then given to reverse the heparin, following adequate hemostasis. Wound was irrigated with saline, closed in layers with Vicryl in a subcuticular fashion.  Sterile dressing applied.  The patient was taken to the recovery room in satisfactory condition.     Quita Skye Hart Rochester, M.D.     JDL/MEDQ  D:  10/08/2010  T:  10/08/2010  Job:  [id number]  Electronically Signed by Josephina Gip M.D. on 10/12/2010 10:25:52 AM

## 2010-10-12 NOTE — Discharge Summary (Signed)
  NAMEJACALYN, [name]              ACCOUNT NO.:  [id number]  MEDICAL RECORD NO.:  [id number]           PATIENT TYPE:  I  LOCATION:  3310                         FACILITY:  [facility]  PHYSICIAN:  Quita Skye. Hart Rochester, M.D.  DATE OF BIRTH:  10-25-1932  DATE OF ADMISSION:  10/08/2010 DATE OF DISCHARGE:  10/09/2010                              DISCHARGE SUMMARY   ADMISSION DIAGNOSIS:  Right internal carotid artery stenosis.  PAST MEDICAL HISTORY AND DISCHARGE DIAGNOSES: 1. Right internal carotid artery stenosis status post right carotid     endarterectomy. 2. Left internal carotid artery stenosis status post recent carotid     endarterectomy. 3. Hypertension. 4. Hyperlipidemia. 5. Arthritis with previous right total knee replacement and left knee     arthroscopy.  BRIEF HISTORY:  The patient is a 77 year old Caucasian female who recently underwent a left carotid endarterectomy for severe asymptomatic stenosis who returned for staged right carotid endarterectomy secondary to a stenosis greater than 80%.  The patient remained asymptomatic.  HOSPITAL COURSE:  The patient was admitted and taken to the OR on Oct 08, 2010 for a right carotid endarterectomy with Dacron patch angioplasty.  The patient tolerated the procedure well and was hemodynamically stable immediately postoperatively.  She was transferred from OR to Postanesthesia Care Unit in stable condition.  The patient was extubated without complication and woke up from anesthesia neurologically intact.  The patient's postoperative course progressed as expected.  She did have one episode of emesis after dinner but her nausea has resolved.  She is currently without complaint.  She has remained afebrile with stable vital signs throughout the hospital course.  PHYSICAL EXAMINATION:  CARDIAC:  Regular rate and rhythm. LUNGS:  Clear to auscultation. ABDOMEN:  Benign. SKIN:  The incision is clean, dry, and intact with no evidence  of hematoma. NEUROLOGIC:  She is neurologically intact.  The patient has been able to ambulate, void, and tolerate a regular diet without any difficulty.  She is felt stable for discharge home at this time.  LABORATORY DATA:  CBC and BMP on Oct 09, 2010, white count 7.7, hemoglobin 10.6, hematocrit 31.2, platelets 152, sodium 135, potassium 3.7, BUN 11, and creatinine 0.70.  DISCHARGE INSTRUCTIONS:  The patient received specific written discharge instructions regarding diet, activity, and wound care.  She will follow up with Dr. Hart Rochester in 2 weeks after discharge.  MEDICATIONS: 1. Oxycodone 5 mg 1-2 p.o. q.4-6 h. p.r.n. pain. 2. Alendronate 70 mg 1 tablet q. week. 3. Aspirin 325 mg daily. 4. Calcium carbonate and vitamin D OTC one p.o. b.i.d. 5. Diovan HCT 320/12.5 mg 1 p.o. daily. 6. Toprol-XL 100 mg daily. 7. Nexium 40 mg daily. 8. Simvastatin 40 mg daily.     Pecola Leisure, PA   ______________________________ Quita Skye Hart Rochester, M.D.    AY/MEDQ  D:  10/09/2010  T:  10/09/2010  Job:  [id number]  Electronically Signed by Pecola Leisure PA on 10/12/2010 09:34:52 AM Electronically Signed by Josephina Gip M.D. on 10/12/2010 10:25:54 AM

## 2010-10-20 ENCOUNTER — Ambulatory Visit (INDEPENDENT_AMBULATORY_CARE_PROVIDER_SITE_OTHER): Payer: Medicare Other | Admitting: Vascular Surgery

## 2010-10-20 DIAGNOSIS — I6529 Occlusion and stenosis of unspecified carotid artery: Secondary | ICD-10-CM

## 2010-10-21 NOTE — Assessment & Plan Note (Signed)
OFFICE VISIT  [name], [name] DOB:  [birth year]                                       10/20/2010 ZOXWR#:[id number]  Patient returns following staged bilateral carotid endarterectomies done over the last several weeks.  Her most recent side was the right side. She had her previous syncopal episode prior to her first operation. Since her second surgery, she has had no problems swallowing.  Denies any hoarseness and has had no hemispheric or nonhemispheric TIAs, amaurosis fugax, diplopia, blurred vision, or syncope.  She is taking 1 aspirin per day.  On physical exam, blood pressure 146/57, heart rate 75, respirations 20. Carotid pulses are 3+ with no audible bruits.  Her right neck incision is healing nicely with mild edema.  Neurologic exam is normal with the exception of a very mild right marginal mandibular nerve paresis with no paresis on the left side.  I reassured her regarding these findings and the fact that she was doing quite well.  She will begin to drive an automobile again, and she will return to see [place] in 6 months for follow-up carotid duplex exam at that time unless she develops any neurologic symptoms in the interim.    Quita Skye Hart Rochester, M.D. Electronically Signed  JDL/MEDQ  D:  10/20/2010  T:  10/21/2010  Job:  [id number]

## 2011-03-04 ENCOUNTER — Encounter: Payer: Self-pay | Admitting: Vascular Surgery

## 2011-03-22 ENCOUNTER — Other Ambulatory Visit: Payer: Self-pay | Admitting: Family Medicine

## 2011-03-22 DIAGNOSIS — Z1231 Encounter for screening mammogram for malignant neoplasm of breast: Secondary | ICD-10-CM

## 2011-04-19 ENCOUNTER — Ambulatory Visit
Admission: RE | Admit: 2011-04-19 | Discharge: 2011-04-19 | Disposition: A | Discharge status: Home / Self Care | Payer: Medicare Other | Source: Ambulatory Visit | Attending: Family Medicine | Admitting: Family Medicine

## 2011-04-19 ENCOUNTER — Encounter: Payer: Self-pay | Admitting: Vascular Surgery

## 2011-04-19 DIAGNOSIS — Z1231 Encounter for screening mammogram for malignant neoplasm of breast: Secondary | ICD-10-CM

## 2011-04-20 ENCOUNTER — Other Ambulatory Visit (INDEPENDENT_AMBULATORY_CARE_PROVIDER_SITE_OTHER): Payer: Medicare Other | Admitting: *Deleted

## 2011-04-20 ENCOUNTER — Ambulatory Visit (INDEPENDENT_AMBULATORY_CARE_PROVIDER_SITE_OTHER): Payer: Medicare Other | Admitting: Vascular Surgery

## 2011-04-20 ENCOUNTER — Encounter: Payer: Self-pay | Admitting: Vascular Surgery

## 2011-04-20 VITALS — BP 186/53 | HR 87 | Resp 20 | Ht 62.0 in | Wt 189.0 lb

## 2011-04-20 DIAGNOSIS — I6529 Occlusion and stenosis of unspecified carotid artery: Secondary | ICD-10-CM

## 2011-04-20 DIAGNOSIS — Z48812 Encounter for surgical aftercare following surgery on the circulatory system: Secondary | ICD-10-CM

## 2011-04-20 NOTE — Progress Notes (Signed)
Subjective:     Patient ID: [name], female   DOB: 1933-01-11, 77 y.o.   MRN: [id number]  HPI this 77 year old female returns for followup regarding her bilateral carotid endarterectomies done in April and May of 2012. She has no history of stroke, lateralizing weakness, amaurosis fugax, diplopia, blurred vision, or syncope. She has been swallowing well and has had no hoarseness. She continues to take 1 aspirin-81 mg per day.  Past Medical History  Diagnosis Date  . Hypertension   . Hyperlipidemia   . Arthritis   . GERD (gastroesophageal reflux disease)   . Carotid artery occlusion     History  Substance Use Topics  . Smoking status: Never Smoker   . Smokeless tobacco: Never Used  . Alcohol Use: No    Family History  Problem Relation Age of Onset  . Heart attack Father     No Known Allergies  Current outpatient prescriptions:acetaminophen (TYLENOL) 650 MG CR tablet, Take 650 mg by mouth every 8 (eight) hours as needed.  , Disp: , Rfl: ;  alendronate (FOSAMAX) 70 MG tablet, Take 70 mg by mouth every 7 (seven) days. Take with a full glass of water on an empty stomach. , Disp: , Rfl: ;  CALCIUM-VITAMIN D PO, Take 1 tablet by mouth 2 (two) times daily.  , Disp: , Rfl:  esomeprazole (NEXIUM) 40 MG capsule, Take 40 mg by mouth daily before breakfast.  , Disp: , Rfl: ;  metoprolol (TOPROL-XL) 100 MG 24 hr tablet, Take 100 mg by mouth daily.  , Disp: , Rfl: ;  oxycodone (OXY-IR) 5 MG capsule, Take 5 mg by mouth every 4 (four) hours as needed.  , Disp: , Rfl: ;  simvastatin (ZOCOR) 40 MG tablet, Take 40 mg by mouth at bedtime.  , Disp: , Rfl:  valsartan-hydrochlorothiazide (DIOVAN-HCT) 320-12.5 MG per tablet, Take 1 tablet by mouth daily.  , Disp: , Rfl: ;  aspirin EC 325 MG tablet, Take 325 mg by mouth daily. TAKING 81MG  DAILY , Disp: , Rfl:   BP 186/53  Pulse 87  Resp 20  Ht 5\' 2"  (1.575 m)  Wt 189 lb (85.73 kg)  BMI 34.57 kg/m2  Body mass index is 34.57  kg/(m^2).         Review of Systems she denies chest pain, dyspnea on exertion, PND, orthopnea. She does have leg discomfort with walking, bilateral swollen legs, varicose veins, and chronic sinus drainage. She denies all other symptoms in the complete review of systems     Objective:   Physical Exam pressure 186/53 heart rate 87 respirations 20 General she is well-developed well-nourished female in no apparent distress alert and oriented x3 Lungs no rhonchi or wheezing Cardiovascular regular rhythm no murmurs carotid pulses 3+ no audible bruits Abdomen obese no palpable masses Lower extremity exam reveals diffuse spider and reticular veins with 1+ edema bilaterally Neurologic exam normal  Today I ordered a carotid duplex exam which I reviewed and interpreted. She has no evidence of flow reduction in her carotid endarterectomy sites bilaterally    Assessment:     Widely patent bilateral carotid endarterectomy sites-asymptomatic    Plan:     Return in 6 months for carotid duplex exam to continue surveillance. Continue aspirin therapy

## 2011-04-26 NOTE — Procedures (Unsigned)
CAROTID DUPLEX EXAM  INDICATION:  Follow up bilateral CEA's.  HISTORY: Diabetes:  No. Cardiac:  No. Hypertension:  Yes. Smoking:  No. Previous Surgery:  Right CEA, 10/08/2010; left CEA, 09/02/2010. CV History: Amaurosis Fugax No, Paresthesias No, Hemiparesis No.                                      RIGHT             LEFT Brachial systolic pressure:         190               190 Brachial Doppler waveforms:         WNL               WNL Vertebral direction of flow:        Antegrade         Antegrade DUPLEX VELOCITIES (cm/sec) CCA peak systolic                   99                80 ECA peak systolic                   170               160 ICA peak systolic                   106               111 ICA end diastolic                   27                22 PLAQUE MORPHOLOGY:                  Heterogenous      Heterogenous PLAQUE AMOUNT:                      Mild              Mild PLAQUE LOCATION:                    ECA               ECA  IMPRESSION: 1. Widely patent bilateral carotid endarterectomies. 2. Mild external carotid artery stenosis observed bilaterally. 3. Bilateral vertebral arteries are within normal limits.  ___________________________________________ Quita Skye Hart Rochester, M.D.  LT/MEDQ  D:  04/20/2011  T:  04/20/2011  Job:  [id number]

## 2011-09-27 ENCOUNTER — Other Ambulatory Visit: Payer: Self-pay | Admitting: *Deleted

## 2011-10-13 ENCOUNTER — Other Ambulatory Visit: Payer: Self-pay | Admitting: *Deleted

## 2011-10-13 DIAGNOSIS — I6529 Occlusion and stenosis of unspecified carotid artery: Secondary | ICD-10-CM

## 2011-10-15 ENCOUNTER — Encounter: Payer: Self-pay | Admitting: Neurosurgery

## 2011-10-19 ENCOUNTER — Ambulatory Visit (INDEPENDENT_AMBULATORY_CARE_PROVIDER_SITE_OTHER): Payer: Medicare Other | Admitting: Neurosurgery

## 2011-10-19 ENCOUNTER — Other Ambulatory Visit (INDEPENDENT_AMBULATORY_CARE_PROVIDER_SITE_OTHER): Payer: Medicare Other | Admitting: *Deleted

## 2011-10-19 ENCOUNTER — Encounter: Payer: Self-pay | Admitting: Neurosurgery

## 2011-10-19 VITALS — BP 188/90 | HR 55 | Resp 16 | Ht 62.0 in | Wt 186.9 lb

## 2011-10-19 DIAGNOSIS — I6529 Occlusion and stenosis of unspecified carotid artery: Secondary | ICD-10-CM

## 2011-10-19 NOTE — Progress Notes (Signed)
[facility] HISTORY AND PHYSICAL   CC: Six-month carotid duplex status post left and right CEA one year ago Referring Physician: Hart Rochester  History of Present Illness: 78 year old female patient of Dr. Hart Rochester who is one year status post a right CEA May 2012, and left CEA April 2012. Patient reports no signs or symptoms of CVA, TIA, amaurosis or any neural deficit. Patient denies any new medical diagnoses or any recent surgeries. This is her second six-month followup since her surgery.  Past Medical History  Diagnosis Date  . Hypertension   . Hyperlipidemia   . Arthritis   . GERD (gastroesophageal reflux disease)   . Carotid artery occlusion   . Peripheral vascular disease     ROS: [x]  Positive   [ ]  Denies    General: [ ]  Weight loss, [ ]  Fever, [ ]  chills Neurologic: [ ]  Dizziness, [ ]  Blackouts, [ ]  Seizure [ ]  Stroke, [ ]  "Mini stroke", [ ]  Slurred speech, [ ]  Temporary blindness; [ ]  weakness in arms or legs, [ ]  Hoarseness Cardiac: [ ]  Chest pain/pressure, [ ]  Shortness of breath at rest [ ]  Shortness of breath with exertion, [ ]  Atrial fibrillation or irregular heartbeat Vascular: [ ]  Pain in legs with walking, [ ]  Pain in legs at rest, [ ]  Pain in legs at night,  [ ]  Non-healing ulcer, [ ]  Blood clot in vein/DVT,   Pulmonary: [ ]  Home oxygen, [ ]  Productive cough, [ ]  Coughing up blood, [ ]  Asthma,  [ ]  Wheezing Musculoskeletal:  [ ]  Arthritis, [ ]  Low back pain, [ ]  Joint pain Hematologic: [ ]  Easy Bruising, [ ]  Anemia; [ ]  Hepatitis Gastrointestinal: [ ]  Blood in stool, [ ]  Gastroesophageal Reflux/heartburn, [ ]  Trouble swallowing Urinary: [ ]  chronic Kidney disease, [ ]  on HD - [ ]  MWF or [ ]  TTHS, [ ]  Burning with urination, [ ]  Difficulty urinating Skin: [ ]  Rashes, [ ]  Wounds Psychological: [ ]  Anxiety, [ ]  Depression   Social History History  Substance Use Topics  . Smoking status: Never Smoker   . Smokeless tobacco: Never Used  .  Alcohol Use: No    Family History Family History  Problem Relation Age of Onset  . Heart attack Father   . Cancer Brother   . Cancer Son   . Cancer Brother     No Known Allergies  Current Outpatient Prescriptions  Medication Sig Dispense Refill  . acetaminophen (TYLENOL) 650 MG CR tablet Take 650 mg by mouth every 8 (eight) hours as needed.        Marland Kitchen alendronate (FOSAMAX) 70 MG tablet Take 70 mg by mouth every 7 (seven) days. Take with a full glass of water on an empty stomach.       Marland Kitchen aspirin EC 325 MG tablet Take 81 mg by mouth daily. TAKING 81MG  DAILY       . CALCIUM-VITAMIN D PO Take 1 tablet by mouth 2 (two) times daily.        Marland Kitchen esomeprazole (NEXIUM) 40 MG capsule Take 40 mg by mouth daily before breakfast.        . metoprolol (TOPROL-XL) 100 MG 24 hr tablet Take 100 mg by mouth daily.        . simvastatin (ZOCOR) 40 MG tablet Take 40 mg by mouth at bedtime.        . valsartan-hydrochlorothiazide (DIOVAN-HCT) 320-12.5 MG per tablet Take 1 tablet by mouth daily.        Marland Kitchen  oxycodone (OXY-IR) 5 MG capsule Take 5 mg by mouth every 4 (four) hours as needed.          Physical Examination  Filed Vitals:   10/19/11 1416  BP: 188/90  Pulse: 55  Resp: 16    Body mass index is 34.18 kg/(m^2).  General:  WDWN in NAD Gait: Normal HEENT: WNL Eyes: Pupils equal Pulmonary: normal non-labored breathing , without Rales, rhonchi,  wheezing Cardiac: RRR, without  Murmurs, rubs or gallops; Abdomen: soft, NT, no masses Skin: no rashes, ulcers noted  Vascular Exam Pulses: 2+ radial pulses bilaterally Carotid bruits: Carotid pulses to auscultation no bruits are heard Extremities without ischemic changes, no Gangrene , no cellulitis; no open wounds;  Musculoskeletal: no muscle wasting or atrophy   Neurologic: A&O X 3; Appropriate Affect ; SENSATION: normal; MOTOR FUNCTION:  moving all extremities equally. Speech is fluent/normal  Non-Invasive Vascular Imaging CAROTID DUPLEX  10/19/2011  Right ICA 0 - 19% stenosis Left ICA 0 - 19% stenosis   ASSESSMENT/PLAN: 78 year old female patient who is doing well one year status post bilateral CEAs one month apart. Patient knows the signs and symptoms of CVA and noticed report to the nearest emergency room should this occur. Her questions were encouraged and answered, she will followup here in one year with repeat carotid duplex and be seen in my clinic.  Lauree Chandler ANP   Clinic MD: Hart Rochester

## 2011-10-20 NOTE — Progress Notes (Signed)
Addended by: Sharee Pimple on: 10/20/2011 09:25 AM   Modules accepted: Orders

## 2011-10-25 NOTE — Procedures (Unsigned)
CAROTID DUPLEX EXAM  INDICATION:  Carotid disease.  HISTORY: Diabetes:  No. Cardiac:  No. Hypertension:  Yes. Smoking:  No. Previous Surgery:  Right carotid endarterectomy on 10/08/2010, left carotid endarterectomy on 09/02/2010. CV History:  Currently asymptomatic. Amaurosis Fugax No, Paresthesias No, Hemiparesis No                                      RIGHT             LEFT Brachial systolic pressure:         192               180 Brachial Doppler waveforms:         Normal            Normal Vertebral direction of flow:        Antegrade         Antegrade DUPLEX VELOCITIES (cm/sec) CCA peak systolic                   88                65 ECA peak systolic                   143               122 ICA peak systolic                   99                93 ICA end diastolic                   28                18 PLAQUE MORPHOLOGY:                  Heterogeneous     Heterogeneous PLAQUE AMOUNT:                      Mild              Mild PLAQUE LOCATION:                    ECA               ECA  IMPRESSION: 1. No hemodynamically significant stenosis noted in the bilateral     internal carotid arteries. 2. No significant change in bilateral Doppler velocities when compared     to the previous exam on 04/20/2011.  ___________________________________________ Quita Skye. Hart Rochester, M.D.  CH/MEDQ  D:  10/22/2011  T:  10/22/2011  Job:  [id number]

## 2012-04-11 ENCOUNTER — Emergency Department (HOSPITAL_COMMUNITY): Payer: Medicare Other

## 2012-04-11 ENCOUNTER — Emergency Department (HOSPITAL_COMMUNITY)
Admission: EM | Admit: 2012-04-11 | Discharge: 2012-04-11 | Disposition: A | Discharge status: Home / Self Care | Payer: Medicare Other | Attending: Emergency Medicine | Admitting: Emergency Medicine

## 2012-04-11 ENCOUNTER — Encounter (HOSPITAL_COMMUNITY): Payer: Self-pay | Admitting: Unknown Physician Specialty

## 2012-04-11 DIAGNOSIS — R0789 Other chest pain: Secondary | ICD-10-CM | POA: Insufficient documentation

## 2012-04-11 DIAGNOSIS — S8990XA Unspecified injury of unspecified lower leg, initial encounter: Secondary | ICD-10-CM | POA: Insufficient documentation

## 2012-04-11 DIAGNOSIS — K219 Gastro-esophageal reflux disease without esophagitis: Secondary | ICD-10-CM | POA: Insufficient documentation

## 2012-04-11 DIAGNOSIS — M542 Cervicalgia: Secondary | ICD-10-CM | POA: Insufficient documentation

## 2012-04-11 DIAGNOSIS — S99929A Unspecified injury of unspecified foot, initial encounter: Secondary | ICD-10-CM | POA: Insufficient documentation

## 2012-04-11 DIAGNOSIS — M129 Arthropathy, unspecified: Secondary | ICD-10-CM | POA: Insufficient documentation

## 2012-04-11 DIAGNOSIS — M7989 Other specified soft tissue disorders: Secondary | ICD-10-CM | POA: Insufficient documentation

## 2012-04-11 DIAGNOSIS — R269 Unspecified abnormalities of gait and mobility: Secondary | ICD-10-CM | POA: Insufficient documentation

## 2012-04-11 DIAGNOSIS — Z7982 Long term (current) use of aspirin: Secondary | ICD-10-CM | POA: Insufficient documentation

## 2012-04-11 DIAGNOSIS — S298XXA Other specified injuries of thorax, initial encounter: Secondary | ICD-10-CM | POA: Insufficient documentation

## 2012-04-11 DIAGNOSIS — Y9389 Activity, other specified: Secondary | ICD-10-CM | POA: Insufficient documentation

## 2012-04-11 DIAGNOSIS — I739 Peripheral vascular disease, unspecified: Secondary | ICD-10-CM | POA: Insufficient documentation

## 2012-04-11 DIAGNOSIS — Z79899 Other long term (current) drug therapy: Secondary | ICD-10-CM | POA: Insufficient documentation

## 2012-04-11 DIAGNOSIS — I10 Essential (primary) hypertension: Secondary | ICD-10-CM | POA: Insufficient documentation

## 2012-04-11 DIAGNOSIS — I6529 Occlusion and stenosis of unspecified carotid artery: Secondary | ICD-10-CM | POA: Insufficient documentation

## 2012-04-11 DIAGNOSIS — E785 Hyperlipidemia, unspecified: Secondary | ICD-10-CM | POA: Insufficient documentation

## 2012-04-11 DIAGNOSIS — Y92410 Unspecified street and highway as the place of occurrence of the external cause: Secondary | ICD-10-CM | POA: Insufficient documentation

## 2012-04-11 LAB — POCT I-STAT, CHEM 8
Calcium, Ion: 1.26 mmol/L (ref 1.13–1.30)
Creatinine, Ser: 0.9 mg/dL (ref 0.50–1.10)
Glucose, Bld: 128 mg/dL — ABNORMAL HIGH (ref 70–99)
HCT: 38 % (ref 36.0–46.0)
Hemoglobin: 12.9 g/dL (ref 12.0–15.0)
Potassium: 4.9 mEq/L (ref 3.5–5.1)

## 2012-04-11 MED ORDER — OXYCODONE-ACETAMINOPHEN 5-325 MG PO TABS
1.0000 | ORAL_TABLET | Freq: Once | ORAL | Status: AC
  Administered 2012-04-11: 1 via ORAL
  Filled 2012-04-11: qty 1

## 2012-04-11 MED ORDER — HYDROCODONE-ACETAMINOPHEN 5-500 MG PO TABS: 1.0000 | ORAL_TABLET | Freq: Four times a day (QID) | ORAL | Status: DC | PRN

## 2012-04-11 MED ORDER — IOHEXOL 350 MG/ML SOLN
80.0000 mL | Freq: Once | INTRAVENOUS | Status: AC | PRN
  Administered 2012-04-11: 80 mL via INTRAVENOUS
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
## 2012-04-11 NOTE — ED Notes (Signed)
Patient has bruising to her left breast area, around her umbilicus and bilateral knees. She states her neck doesn't hurt with the c-collar on but did prior to.

## 2012-04-13 NOTE — ED Provider Notes (Signed)
Medical screening examination/treatment/procedure(s) were conducted as a shared visit with non-physician practitioner(s) and myself.  I personally evaluated the patient during the encounter   Patient with continued chest pain despite normal chest x-ray.  Given her tenderness overlying her sternum a CT scan was performed to evaluate for underlying injury including sternal fracture.  No sternal fracture noted.  No mediastinal hematoma.  Repeat abdominal exam without significant tenderness.  Home with pain management.  PCP followup.  Understands return to the ER for new or worsening symptoms  Lyanne Co, MD 04/13/12 1546

## 2012-10-18 ENCOUNTER — Ambulatory Visit: Payer: Medicare Other | Admitting: Neurosurgery

## 2012-10-18 ENCOUNTER — Other Ambulatory Visit (INDEPENDENT_AMBULATORY_CARE_PROVIDER_SITE_OTHER): Payer: Medicare Other | Admitting: *Deleted

## 2012-10-18 DIAGNOSIS — Z48812 Encounter for surgical aftercare following surgery on the circulatory system: Secondary | ICD-10-CM

## 2012-10-18 DIAGNOSIS — I6529 Occlusion and stenosis of unspecified carotid artery: Secondary | ICD-10-CM

## 2013-04-12 ENCOUNTER — Other Ambulatory Visit: Payer: Self-pay | Admitting: Family Medicine

## 2013-04-12 DIAGNOSIS — Z1231 Encounter for screening mammogram for malignant neoplasm of breast: Secondary | ICD-10-CM

## 2013-04-12 DIAGNOSIS — M810 Age-related osteoporosis without current pathological fracture: Secondary | ICD-10-CM

## 2013-05-18 ENCOUNTER — Ambulatory Visit: Payer: Medicare Other

## 2013-05-18 ENCOUNTER — Other Ambulatory Visit: Payer: Medicare Other

## 2013-05-22 ENCOUNTER — Ambulatory Visit
Admission: RE | Admit: 2013-05-22 | Discharge: 2013-05-22 | Disposition: A | Discharge status: Home / Self Care | Payer: Medicare Other | Source: Ambulatory Visit | Attending: Family Medicine | Admitting: Family Medicine

## 2013-05-22 DIAGNOSIS — Z1231 Encounter for screening mammogram for malignant neoplasm of breast: Secondary | ICD-10-CM

## 2013-05-22 DIAGNOSIS — M810 Age-related osteoporosis without current pathological fracture: Secondary | ICD-10-CM

## 2013-05-29 ENCOUNTER — Other Ambulatory Visit: Payer: Self-pay | Admitting: Family Medicine

## 2013-05-29 DIAGNOSIS — R928 Other abnormal and inconclusive findings on diagnostic imaging of breast: Secondary | ICD-10-CM

## 2013-06-05 ENCOUNTER — Ambulatory Visit
Admission: RE | Admit: 2013-06-05 | Discharge: 2013-06-05 | Disposition: A | Discharge status: Home / Self Care | Payer: Medicare Other | Source: Ambulatory Visit | Attending: Family Medicine | Admitting: Family Medicine

## 2013-06-05 DIAGNOSIS — R928 Other abnormal and inconclusive findings on diagnostic imaging of breast: Secondary | ICD-10-CM

## 2014-10-29 ENCOUNTER — Other Ambulatory Visit: Payer: Self-pay

## 2014-10-29 DIAGNOSIS — Z1231 Encounter for screening mammogram for malignant neoplasm of breast: Secondary | ICD-10-CM

## 2014-11-28 ENCOUNTER — Ambulatory Visit
Admission: RE | Admit: 2014-11-28 | Discharge: 2014-11-28 | Disposition: A | Discharge status: Home / Self Care | Payer: Medicare Other | Source: Ambulatory Visit

## 2014-11-28 DIAGNOSIS — Z1231 Encounter for screening mammogram for malignant neoplasm of breast: Secondary | ICD-10-CM

## 2017-02-08 ENCOUNTER — Other Ambulatory Visit: Payer: Self-pay | Admitting: Physician Assistant

## 2017-02-08 DIAGNOSIS — R42 Dizziness and giddiness: Secondary | ICD-10-CM

## 2017-02-09 ENCOUNTER — Ambulatory Visit
Admission: RE | Admit: 2017-02-09 | Discharge: 2017-02-09 | Disposition: A | Discharge status: Home / Self Care | Payer: Medicare Other | Source: Ambulatory Visit | Attending: Physician Assistant | Admitting: Physician Assistant

## 2017-02-09 DIAGNOSIS — R42 Dizziness and giddiness: Secondary | ICD-10-CM

## 2019-06-26 ENCOUNTER — Other Ambulatory Visit: Payer: Self-pay | Admitting: Family Medicine

## 2019-06-26 DIAGNOSIS — R4189 Other symptoms and signs involving cognitive functions and awareness: Secondary | ICD-10-CM

## 2019-06-26 DIAGNOSIS — R2689 Other abnormalities of gait and mobility: Secondary | ICD-10-CM

## 2019-06-26 DIAGNOSIS — S0990XA Unspecified injury of head, initial encounter: Secondary | ICD-10-CM

## 2019-07-05 ENCOUNTER — Other Ambulatory Visit: Payer: Medicare Other

## 2019-10-19 ENCOUNTER — Emergency Department (HOSPITAL_COMMUNITY)
Admission: EM | Admit: 2019-10-19 | Discharge: 2019-10-19 | Disposition: A | Discharge status: Home / Self Care | Payer: Medicare Other | Attending: Emergency Medicine | Admitting: Emergency Medicine

## 2019-10-19 ENCOUNTER — Other Ambulatory Visit: Payer: Self-pay

## 2019-10-19 ENCOUNTER — Encounter (HOSPITAL_COMMUNITY): Payer: Self-pay | Admitting: Emergency Medicine

## 2019-10-19 ENCOUNTER — Emergency Department (HOSPITAL_COMMUNITY): Payer: Medicare Other

## 2019-10-19 DIAGNOSIS — R42 Dizziness and giddiness: Secondary | ICD-10-CM | POA: Insufficient documentation

## 2019-10-19 DIAGNOSIS — I4891 Unspecified atrial fibrillation: Secondary | ICD-10-CM | POA: Insufficient documentation

## 2019-10-19 DIAGNOSIS — Z79899 Other long term (current) drug therapy: Secondary | ICD-10-CM | POA: Insufficient documentation

## 2019-10-19 DIAGNOSIS — I10 Essential (primary) hypertension: Secondary | ICD-10-CM | POA: Diagnosis not present

## 2019-10-19 LAB — CBC
HCT: 37.3 % (ref 36.0–46.0)
Hemoglobin: 12.1 g/dL (ref 12.0–15.0)
MCH: 32.4 pg (ref 26.0–34.0)
MCHC: 32.4 g/dL (ref 30.0–36.0)
MCV: 99.7 fL (ref 80.0–100.0)
Platelets: 191 10*3/uL (ref 150–400)
RBC: 3.74 MIL/uL — ABNORMAL LOW (ref 3.87–5.11)
RDW: 11.9 % (ref 11.5–15.5)
WBC: 7.3 10*3/uL (ref 4.0–10.5)
nRBC: 0 % (ref 0.0–0.2)

## 2019-10-19 LAB — BASIC METABOLIC PANEL
Anion gap: 10 (ref 5–15)
BUN: 41 mg/dL — ABNORMAL HIGH (ref 8–23)
CO2: 29 mmol/L (ref 22–32)
Calcium: 9.6 mg/dL (ref 8.9–10.3)
Chloride: 99 mmol/L (ref 98–111)
Creatinine, Ser: 1.09 mg/dL — ABNORMAL HIGH (ref 0.44–1.00)
GFR calc Af Amer: 54 mL/min — ABNORMAL LOW (ref >60)
GFR calc non Af Amer: 47 mL/min — ABNORMAL LOW (ref >60)
Glucose, Bld: 114 mg/dL — ABNORMAL HIGH (ref 70–99)
Potassium: 4.6 mmol/L (ref 3.5–5.1)
Sodium: 138 mmol/L (ref 135–145)

## 2019-10-19 MED ORDER — SODIUM CHLORIDE 0.9% FLUSH: 3.0000 mL | Freq: Once | INTRAVENOUS | Status: DC

## 2019-10-19 MED ORDER — APIXABAN 2.5 MG PO TABS: 2.5000 mg | ORAL_TABLET | Freq: Two times a day (BID) | ORAL | 0 refills | Status: DC

## 2019-10-19 MED ORDER — APIXABAN 2.5 MG PO TABS
2.5000 mg | ORAL_TABLET | Freq: Once | ORAL | Status: AC
  Administered 2019-10-19: 2.5 mg via ORAL
  Filled 2019-10-19: qty 1

## 2019-10-19 NOTE — ED Notes (Signed)
Pt denies CP at this time 

## 2019-10-19 NOTE — Discharge Instructions (Signed)
Call on Tuesday to arrange follow-up this week in the atrial fibrillation clinic.  Start taking the Eliquis.  Be alert for any head injuries.  Return to the emergency room if she has any worsening symptoms such as shortness of breath, fast heart rate, increased dizziness or any other worsening symptoms.  If you are able to cut the losartan in half, try taking half a tablet once daily and monitor her blood pressures closely.  Information on my medicine - ELIQUIS (apixaban)  This medication education was reviewed with me or my healthcare representative as part of my discharge preparation.  Why was Eliquis prescribed for you? Eliquis was prescribed for you to reduce the risk of a blood clot forming that can cause a stroke if you have a medical condition called atrial fibrillation (a type of irregular heartbeat).  What do You need to know about Eliquis ? Take your Eliquis TWICE DAILY - one tablet in the morning and one tablet in the evening with or without food. If you have difficulty swallowing the tablet whole please discuss with your pharmacist how to take the medication safely.  Take Eliquis exactly as prescribed by your doctor and DO NOT stop taking Eliquis without talking to the doctor who prescribed the medication.  Stopping may increase your risk of developing a stroke.  Refill your prescription before you run out.  After discharge, you should have regular check-up appointments with your healthcare provider that is prescribing your Eliquis.  In the future your dose may need to be changed if your kidney function or weight changes by a significant amount or as you get older.  What do you do if you miss a dose? If you miss a dose, take it as soon as you remember on the same day and resume taking twice daily.  Do not take more than one dose of ELIQUIS at the same time to make up a missed dose.  Important Safety Information A possible side effect of Eliquis is bleeding. You should call  your healthcare provider right away if you experience any of the following: ? Bleeding from an injury or your nose that does not stop. ? Unusual colored urine (red or dark brown) or unusual colored stools (red or black). ? Unusual bruising for unknown reasons. ? A serious fall or if you hit your head (even if there is no bleeding).  Some medicines may interact with Eliquis and might increase your risk of bleeding or clotting while on Eliquis. To help avoid this, consult your healthcare provider or pharmacist prior to using any new prescription or non-prescription medications, including herbals, vitamins, non-steroidal anti-inflammatory drugs (NSAIDs) and supplements.  This website has more information on Eliquis (apixaban): http://www.eliquis.com/eliquis/home

## 2019-10-19 NOTE — ED Triage Notes (Signed)
Pt sent by her doctor for new onset afib. Pt denies chest pain, reports occasional shortness of breath.

## 2019-10-19 NOTE — ED Notes (Signed)
Patient verbalizes understanding of discharge instructions. Opportunity for questioning and answers were provided. Armband removed by staff, pt discharged from ED. Pt. ambulatory and discharged home.  

## 2019-10-19 NOTE — ED Provider Notes (Signed)
[facility] EMERGENCY DEPARTMENT Provider Note   CSN: [id number] Arrival date & time: 10/19/19  1738     History Chief Complaint  Patient presents with  . Atrial Fibrillation    [name] is a 86 y.o. female.  Patient is a 86 year old female with a history of dementia, carotid artery stenosis, hypertension, hyperlipidemia, pulmonary fibrosis who presents with atrial fibrillation.  She was being seen at her PCPs office today for a routine follow-up visit for her chronic health problems.  She was noted to be in A. fib without RVR.  She has not been complaining of any chest pain or shortness of breath.  She does have some occasional dizzy spells at home which per her daughter have been going on for probably the last 2 to 3 months.  This is unchanged.  She does not report any palpitations.  No increased leg swelling.  No known history of atrial fibrillation.  She was noted to be hypotensive at her PCPs office today with a blood pressure in the 70s systolic.  She is on the metoprolol and losartan for blood pressure management.        Past Medical History:  Diagnosis Date  . Arthritis   . Carotid artery occlusion   . GERD (gastroesophageal reflux disease)   . Hyperlipidemia   . Hypertension   . Peripheral vascular disease (HCC)     There are no problems to display for this patient.   Past Surgical History:  Procedure Laterality Date  . CAROTID ENDARTERECTOMY    . JOINT REPLACEMENT  2003   Right knee  . KNEE SURGERY     left  . TOTAL KNEE ARTHROPLASTY     right knee     OB History   No obstetric history on file.     Family History  Problem Relation Age of Onset  . Heart attack Father   . Cancer Brother   . Cancer Son   . Cancer Brother     Social History   Tobacco Use  . Smoking status: Never Smoker  . Smokeless tobacco: Never Used  Substance Use Topics  . Alcohol use: No  . Drug use: No    Home Medications Prior to Admission  medications   Medication Sig Start Date End Date Taking? Authorizing Provider  acetaminophen (TYLENOL) 650 MG CR tablet Take 1,300 mg by mouth every 8 (eight) hours. For arthritis in knees/lower back    [provider]  alendronate (FOSAMAX) 70 MG tablet Take 70 mg by mouth every 7 (seven) days. Take with a full glass of water on an empty stomach.     [provider]  aspirin EC 81 MG tablet Take 81 mg by mouth daily.    [provider]  CALCIUM-VITAMIN D PO Take 1 tablet by mouth 2 (two) times daily.      [provider]  esomeprazole (NEXIUM) 40 MG capsule Take 40 mg by mouth daily before breakfast.      [provider]  HYDROcodone-acetaminophen (VICODIN) 5-500 MG per tablet Take 1-2 tablets by mouth every 6 (six) hours as needed for pain. 04/11/12   Muthersbaugh, Dahlia Client, PA-C  metoprolol (TOPROL-XL) 100 MG 24 hr tablet Take 100 mg by mouth daily.      [provider]  simvastatin (ZOCOR) 40 MG tablet Take 40 mg by mouth every evening.     [provider]  valsartan-hydrochlorothiazide (DIOVAN-HCT) 320-12.5 MG per tablet Take 1 tablet by mouth daily.  [provider]    Allergies    Patient has no known allergies.  Review of Systems   Review of Systems  Constitutional: Negative for chills, diaphoresis, fatigue and fever.  HENT: Negative for congestion, rhinorrhea and sneezing.   Eyes: Negative.   Respiratory: Negative for cough, chest tightness and shortness of breath.   Cardiovascular: Negative for chest pain and leg swelling.  Gastrointestinal: Negative for abdominal pain, blood in stool, diarrhea, nausea and vomiting.  Genitourinary: Negative for difficulty urinating, flank pain, frequency and hematuria.  Musculoskeletal: Negative for arthralgias and back pain.  Skin: Negative for rash.  Neurological: Positive for dizziness (Intermittent). Negative for speech difficulty, weakness, numbness and headaches.     Physical Exam Updated Vital Signs BP 127/80   Pulse 73   Temp 98.2 F (36.8 C) (Oral)   Resp 16   Ht 4\' 11"  (1.499 m)   Wt 57.2 kg   SpO2 98%   BMI 25.45 kg/m   Physical Exam Constitutional:      Appearance: She is well-developed.  HENT:     Head: Normocephalic and atraumatic.  Eyes:     Pupils: Pupils are equal, round, and reactive to light.  Cardiovascular:     Rate and Rhythm: Normal rate. Rhythm irregular.     Heart sounds: Normal heart sounds.  Pulmonary:     Effort: Pulmonary effort is normal. No respiratory distress.     Breath sounds: Normal breath sounds. No wheezing or rales.  Chest:     Chest wall: No tenderness.  Abdominal:     General: Bowel sounds are normal.     Palpations: Abdomen is soft.     Tenderness: There is no abdominal tenderness. There is no guarding or rebound.  Musculoskeletal:        General: Normal range of motion.     Cervical back: Normal range of motion and neck supple.     Comments: Trace edema to lower extremities bilaterally, no calf tenderness  Lymphadenopathy:     Cervical: No cervical adenopathy.  Skin:    General: Skin is warm and dry.     Findings: No rash.  Neurological:     Mental Status: She is alert and oriented to person, place, and time.     ED Results / Procedures / Treatments   Labs (all labs ordered are listed, but only abnormal results are displayed) Labs Reviewed  BASIC METABOLIC PANEL - Abnormal; Notable for the following components:      Result Value   Glucose, Bld 114 (*)    BUN 41 (*)    Creatinine, Ser 1.09 (*)    GFR calc non Af Amer 47 (*)    GFR calc Af Amer 54 (*)    All other components within normal limits  CBC - Abnormal; Notable for the following components:   RBC 3.74 (*)    All other components within normal limits    EKG EKG Interpretation  Date/Time:  Friday Oct 19 2019 17:58:39 EDT Ventricular Rate:  82 PR Interval:    QRS Duration: 82 QT Interval:  384 QTC  Calculation: 448 R Axis:   -32 Text Interpretation: Atrial fibrillation Left axis deviation Abnormal ECG changed from prior EKG Confirmed by [id number] [phone]) on 10/19/2019 8:01:29 PM   Radiology DG Chest 2 View  Result Date: 10/19/2019 CLINICAL DATA:  Atrial fibrillation. EXAM: CHEST - 2 VIEW COMPARISON:  06/21/2016 FINDINGS: Stable mild-to-moderate cardiomegaly. Stable chronic pulmonary interstitial fibrosis. No evidence of superimposed pulmonary  infiltrate or pleural effusion. Aortic atherosclerosis noted. IMPRESSION: Stable cardiomegaly and chronic pulmonary interstitial fibrosis. No acute findings. Electronically Signed   By: Marlaine Hind M.D.   On: 10/19/2019 19:10    Procedures Procedures (including critical care time)  Medications Ordered in ED Medications  sodium chloride flush (NS) 0.9 % injection 3 mL (has no administration in time range)  apixaban (ELIQUIS) tablet 2.5 mg (has no administration in time range)    ED Course  I have reviewed the triage vital signs and the nursing notes.  Pertinent labs & imaging results that were available during my care of the patient were reviewed by me and considered in my medical decision making (see chart for details).    MDM Rules/Calculators/A&P                      Patient is a 86 year old female who presents with atrial fibrillation.  Her rate is well controlled.  She is asymptomatic.  Her labs are nonconcerning.  She has been having some intermittent dizzy spells that have been going on for the last few months.  It does not seem to be any change in this.  Her blood pressure was low at her doctor's office but has been fine since she has been here.  I did discuss with the family trying a lower dose of her losartan and monitoring her blood pressures and see if that improves her dizziness.  I spoke with the cardiology fellow on-call, Dr. Vickki Muff, who recommends the patient be discharged with follow-up in the A. fib clinic.  It is  recommended that she start an anticoagulant.  Given her elevated creatinine, we will start her on Eliquis at a low-dose of 2.5 mg twice daily.  I had a long discussion with the family about the risks versus the benefits of anticoagulants.  Her daughter who is power of attorney has decided to go forth with the anticoagulants.  She was started on Eliquis.  She was discharged home in good condition to follow-up with A. fib clinic.  Return precautions were given.  This patients CHA2DS2-VASc Score and unadjusted Ischemic Stroke Rate (% per year) is equal to 4.8 % stroke rate/year from a score of 4  Above score calculated as 1 point each if present [CHF, HTN, DM, Vascular=MI/PAD/Aortic Plaque, Age if 65-74, or Female] Above score calculated as 2 points each if present [Age > 75, or Stroke/TIA/TE]    Final Clinical Impression(s) / ED Diagnoses Final diagnoses:  Atrial fibrillation, unspecified type [facility])    Rx / [place] Orders ED Discharge Orders    None       Malvin Johns, MD 10/19/19 2143

## 2019-10-23 ENCOUNTER — Telehealth (HOSPITAL_COMMUNITY): Payer: Self-pay | Admitting: Nurse Practitioner

## 2019-10-23 NOTE — Telephone Encounter (Signed)
Called and spoke with patient.  She is aware of appt on 10/25/19 @1 :30 pm for ED f/u.  Pt given location info and phone number for AF Clinic.

## 2019-10-25 ENCOUNTER — Ambulatory Visit (HOSPITAL_COMMUNITY)
Admission: RE | Admit: 2019-10-25 | Discharge: 2019-10-25 | Disposition: A | Discharge status: Home / Self Care | Payer: Medicare Other | Source: Ambulatory Visit | Attending: Physician Assistant | Admitting: Physician Assistant

## 2019-10-25 ENCOUNTER — Other Ambulatory Visit: Payer: Self-pay

## 2019-10-25 VITALS — BP 106/62 | HR 79 | Ht 59.0 in | Wt 125.4 lb

## 2019-10-25 DIAGNOSIS — I739 Peripheral vascular disease, unspecified: Secondary | ICD-10-CM | POA: Diagnosis not present

## 2019-10-25 DIAGNOSIS — D6869 Other thrombophilia: Secondary | ICD-10-CM | POA: Insufficient documentation

## 2019-10-25 DIAGNOSIS — I480 Paroxysmal atrial fibrillation: Secondary | ICD-10-CM

## 2019-10-25 DIAGNOSIS — I4819 Other persistent atrial fibrillation: Secondary | ICD-10-CM | POA: Diagnosis present

## 2019-10-25 DIAGNOSIS — I10 Essential (primary) hypertension: Secondary | ICD-10-CM | POA: Insufficient documentation

## 2019-10-25 DIAGNOSIS — K219 Gastro-esophageal reflux disease without esophagitis: Secondary | ICD-10-CM | POA: Diagnosis not present

## 2019-10-25 DIAGNOSIS — I6529 Occlusion and stenosis of unspecified carotid artery: Secondary | ICD-10-CM | POA: Insufficient documentation

## 2019-10-25 DIAGNOSIS — Z79899 Other long term (current) drug therapy: Secondary | ICD-10-CM | POA: Diagnosis not present

## 2019-10-25 DIAGNOSIS — Z7901 Long term (current) use of anticoagulants: Secondary | ICD-10-CM | POA: Diagnosis not present

## 2019-10-25 DIAGNOSIS — E785 Hyperlipidemia, unspecified: Secondary | ICD-10-CM | POA: Insufficient documentation

## 2019-10-25 DIAGNOSIS — Z8249 Family history of ischemic heart disease and other diseases of the circulatory system: Secondary | ICD-10-CM | POA: Diagnosis not present

## 2019-10-25 MED ORDER — APIXABAN 2.5 MG PO TABS: 2.5000 mg | ORAL_TABLET | Freq: Two times a day (BID) | ORAL | 3 refills | Status: DC

## 2019-10-25 NOTE — Progress Notes (Signed)
Primary Care Physician: Delorse Lek, MD Primary Cardiologist: none Primary Electrophysiologist: none Referring Physician: [facility]   [name] is a 86 y.o. female with a history of dementia, carotid artery disease, HTN, HLD, pulmonary fibrosis, persistent atrial fibrillation who presents for consultation in the [facility] Atrial Fibrillation [facility]. The patient was initially diagnosed with atrial fibrillation on 10/19/19 incidentally at her PCP office during a routine visit. She was noted to have rapid rates and was sent to the ED. While there, her heart rate and BP normalized. Patient had no awareness of her arrhythmia. Patient was started on Eliquis for a CHADS2VASC score of 5. Patient is a difficult historian. History provided by chart and daughter in law. Patient remains in rate controlled afib today and is unaware of her arrhythmia. She denies snoring or alcohol use.  Today, she denies symptoms of palpitations, chest pain, shortness of breath, orthopnea, PND, lower extremity edema, dizziness, presyncope, syncope, snoring, daytime somnolence, bleeding, or neurologic sequela. The patient is tolerating medications without difficulties and is otherwise without complaint today.    Atrial Fibrillation Risk Factors:  she does not have symptoms or diagnosis of sleep apnea. she does not have a history of rheumatic fever. she does not have a history of alcohol use. The patient does not have a history of early familial atrial fibrillation or other arrhythmias.  she has a BMI of Body mass index is 25.33 kg/m.Marland Kitchen Filed Weights   10/25/19 1343  Weight: 56.9 kg    Family History  Problem Relation Age of Onset   Heart attack Father    Cancer Brother    Cancer Son    Cancer Brother      Atrial Fibrillation Management history:  Previous antiarrhythmic drugs: none Previous cardioversions: none Previous ablations: none CHADS2VASC score: 5 Anticoagulation history:  Eliquis   Past Medical History:  Diagnosis Date   Arthritis    Carotid artery occlusion    GERD (gastroesophageal reflux disease)    Hyperlipidemia    Hypertension    Peripheral vascular disease (HCC)    Past Surgical History:  Procedure Laterality Date   CAROTID ENDARTERECTOMY     JOINT REPLACEMENT  2003   Right knee   KNEE SURGERY     left   TOTAL KNEE ARTHROPLASTY     right knee    Current Outpatient Medications  Medication Sig Dispense Refill   acetaminophen (TYLENOL) 650 MG CR tablet Take 650 mg by mouth in the morning and at bedtime. For arthritis in knees/lower back     apixaban (ELIQUIS) 2.5 MG TABS tablet Take 1 tablet (2.5 mg total) by mouth 2 (two) times daily. 60 tablet 3   CALCIUM-VITAMIN D PO Chews 2 chocolate pieces by mouth daily- one in the am and one in the pm     losartan (COZAAR) 100 MG tablet Take 50 mg by mouth daily.      metoprolol succinate (TOPROL-XL) 50 MG 24 hr tablet Take 50 mg by mouth daily.      Multiple Vitamin (MULTIVITAMIN) capsule Take by mouth in the morning and at bedtime.      NON FORMULARY Pair of compression stockings 15-69mm Hg     No current facility-administered medications for this encounter.    No Known Allergies  Social History   Socioeconomic History   Marital status: Widowed    Spouse name: Not on file   Number of children: Not on file   Years of education: Not on  file   Highest education level: Not on file  Occupational History   Not on file  Tobacco Use   Smoking status: Never Smoker   Smokeless tobacco: Never Used  Substance and Sexual Activity   Alcohol use: No   Drug use: No   Sexual activity: Not on file  Other Topics Concern   Not on file  Social History Narrative   Not on file   Social Determinants of Health   Financial Resource Strain:    Difficulty of Paying Living Expenses:   Food Insecurity:    Worried About Charity fundraiser in the Last Year:    Academic librarian in the Last Year:   Transportation Needs:    Film/video editor (Medical):    Lack of Transportation (Non-Medical):   Physical Activity:    Days of Exercise per Week:    Minutes of Exercise per Session:   Stress:    Feeling of Stress :   Social Connections:    Frequency of Communication with Friends and Family:    Frequency of Social Gatherings with Friends and Family:    Attends Religious Services:    Active Member of Clubs or Organizations:    Attends Music therapist:    Marital Status:   Intimate Partner Violence:    Fear of Current or Ex-Partner:    Emotionally Abused:    Physically Abused:    Sexually Abused:      ROS- All systems are reviewed and negative except as per the HPI above.  Physical Exam: Vitals:   10/25/19 1343  BP: 106/62  Pulse: 79  Weight: 56.9 kg  Height: 4\' 11"  (1.499 m)    GEN- The patient is well appearing elderly female, alert and oriented x 3 today.   Head- normocephalic, atraumatic Eyes-  Sclera clear, conjunctiva pink Ears- hearing intact Oropharynx- clear Neck- supple  Lungs- Clear to ausculation bilaterally, normal work of breathing Heart- irregular rate and rhythm, no murmurs, rubs or gallops  GI- soft, NT, ND, + BS Extremities- no clubbing, cyanosis, or edema MS- no significant deformity or atrophy Skin- no rash or lesion Psych- euthymic mood, full affect Neuro- strength and sensation are intact  Wt Readings from Last 3 Encounters:  10/25/19 56.9 kg  10/19/19 57.2 kg  10/19/11 84.8 kg    EKG today demonstrates afib HR 79, slow R wave prog, QRS 84, QTc 440  Epic records are reviewed at length today  CHA2DS2-VASc Score = 5  The patient's score is based upon: CHF History: 0 HTN History: 1 Age : 2 Diabetes History: 0 Stroke History: 0 Vascular Disease History: 1 Gender: 1      ASSESSMENT AND PLAN: 1. Persistent Atrial Fibrillation (ICD10:  I48.19) The patient's CHA2DS2-VASc  score is 5, indicating a 7.2% annual risk of stroke.   General education about afib provided and questions answered. We also discussed her stroke risk and the risks and benefits of anticoagulation. Will check echocardiogram. We discussed therapeutic options today. Given her age, comorbidities, and paucity of symptoms will pursue a conservative treatment with rate control. Patient and family in agreement in plan. Continue Eliquis 2.5 mg BID (age, weight <60kg) Continue Toprol 50 mg daily Continue ASA 81 mg given h/o carotid artery disease.   2. Secondary Hypercoagulable State (ICD10:  D68.69) The patient is at significant risk for stroke/thromboembolism based upon her CHA2DS2-VASc Score of 5.  Continue Apixaban (Eliquis).   3. HTN Stable, no changes today.  Follow up in the AF clinic in one month.    Jorja Loa PA-C Afib Clinic [facility] [address] [place], [place] [address] [phone] 10/25/2019 2:31 PM

## 2019-11-02 ENCOUNTER — Ambulatory Visit (HOSPITAL_COMMUNITY)
Admission: RE | Admit: 2019-11-02 | Discharge: 2019-11-02 | Disposition: A | Discharge status: Home / Self Care | Payer: Medicare Other | Source: Ambulatory Visit | Attending: Physician Assistant | Admitting: Physician Assistant

## 2019-11-02 ENCOUNTER — Other Ambulatory Visit: Payer: Self-pay

## 2019-11-02 DIAGNOSIS — I313 Pericardial effusion (noninflammatory): Secondary | ICD-10-CM | POA: Diagnosis not present

## 2019-11-02 DIAGNOSIS — I10 Essential (primary) hypertension: Secondary | ICD-10-CM | POA: Diagnosis not present

## 2019-11-02 DIAGNOSIS — I480 Paroxysmal atrial fibrillation: Secondary | ICD-10-CM | POA: Diagnosis present

## 2019-11-02 DIAGNOSIS — I071 Rheumatic tricuspid insufficiency: Secondary | ICD-10-CM | POA: Insufficient documentation

## 2019-11-02 DIAGNOSIS — E785 Hyperlipidemia, unspecified: Secondary | ICD-10-CM | POA: Diagnosis not present

## 2019-11-02 DIAGNOSIS — I340 Nonrheumatic mitral (valve) insufficiency: Secondary | ICD-10-CM | POA: Diagnosis not present

## 2019-11-02 NOTE — Progress Notes (Signed)
Echocardiogram 2D Echocardiogram has been performed.  Warren Lacy Hester Joslin 11/02/2019, 1:45 PM

## 2019-11-22 ENCOUNTER — Encounter (HOSPITAL_COMMUNITY): Payer: Self-pay | Admitting: Physician Assistant

## 2019-11-22 ENCOUNTER — Other Ambulatory Visit: Payer: Self-pay

## 2019-11-22 ENCOUNTER — Ambulatory Visit (HOSPITAL_COMMUNITY)
Admission: RE | Admit: 2019-11-22 | Discharge: 2019-11-22 | Disposition: A | Discharge status: Home / Self Care | Payer: Medicare Other | Source: Ambulatory Visit | Attending: Physician Assistant | Admitting: Physician Assistant

## 2019-11-22 VITALS — BP 146/90 | HR 75 | Ht 59.0 in | Wt 128.6 lb

## 2019-11-22 DIAGNOSIS — I10 Essential (primary) hypertension: Secondary | ICD-10-CM | POA: Insufficient documentation

## 2019-11-22 DIAGNOSIS — Z79899 Other long term (current) drug therapy: Secondary | ICD-10-CM | POA: Diagnosis not present

## 2019-11-22 DIAGNOSIS — E785 Hyperlipidemia, unspecified: Secondary | ICD-10-CM | POA: Insufficient documentation

## 2019-11-22 DIAGNOSIS — I4821 Permanent atrial fibrillation: Secondary | ICD-10-CM | POA: Diagnosis present

## 2019-11-22 DIAGNOSIS — Z8249 Family history of ischemic heart disease and other diseases of the circulatory system: Secondary | ICD-10-CM | POA: Insufficient documentation

## 2019-11-22 DIAGNOSIS — D6869 Other thrombophilia: Secondary | ICD-10-CM | POA: Insufficient documentation

## 2019-11-22 DIAGNOSIS — Z7901 Long term (current) use of anticoagulants: Secondary | ICD-10-CM | POA: Insufficient documentation

## 2019-11-22 DIAGNOSIS — I739 Peripheral vascular disease, unspecified: Secondary | ICD-10-CM | POA: Diagnosis not present

## 2019-11-22 LAB — BASIC METABOLIC PANEL
Anion gap: 8 (ref 5–15)
BUN: 37 mg/dL — ABNORMAL HIGH (ref 8–23)
CO2: 29 mmol/L (ref 22–32)
Calcium: 9.7 mg/dL (ref 8.9–10.3)
Chloride: 102 mmol/L (ref 98–111)
Creatinine, Ser: 1.08 mg/dL — ABNORMAL HIGH (ref 0.44–1.00)
GFR calc Af Amer: 55 mL/min — ABNORMAL LOW (ref >60)
GFR calc non Af Amer: 47 mL/min — ABNORMAL LOW (ref >60)
Glucose, Bld: 121 mg/dL — ABNORMAL HIGH (ref 70–99)
Potassium: 4.3 mmol/L (ref 3.5–5.1)
Sodium: 139 mmol/L (ref 135–145)

## 2019-11-22 LAB — CBC
HCT: 38.7 % (ref 36.0–46.0)
Hemoglobin: 12.4 g/dL (ref 12.0–15.0)
MCH: 32.5 pg (ref 26.0–34.0)
MCHC: 32 g/dL (ref 30.0–36.0)
MCV: 101.3 fL — ABNORMAL HIGH (ref 80.0–100.0)
Platelets: 204 10*3/uL (ref 150–400)
RBC: 3.82 MIL/uL — ABNORMAL LOW (ref 3.87–5.11)
RDW: 12.5 % (ref 11.5–15.5)
WBC: 7.1 10*3/uL (ref 4.0–10.5)
nRBC: 0 % (ref 0.0–0.2)

## 2019-11-22 NOTE — Progress Notes (Signed)
Primary Care Physician: Delorse Lek, MD Primary Cardiologist: none Primary Electrophysiologist: none Referring Physician: [facility]   [name] is a 86 y.o. female with a history of dementia, carotid artery disease, HTN, HLD, pulmonary fibrosis, persistent atrial fibrillation who presents for follow up in the [facility] Atrial Fibrillation Clinic. The patient was initially diagnosed with atrial fibrillation on 10/19/19 incidentally at her PCP office during a routine visit. She was noted to have rapid rates and was sent to the ED. While there, her heart rate and BP normalized. Patient had no awareness of her arrhythmia. Patient was started on Eliquis for a CHADS2VASC score of 5. Patient is a difficult historian. History provided by chart and daughter in law. Patient remains in rate controlled afib today and is unaware of her arrhythmia. She denies snoring or alcohol use.  On follow up today, patient's daughter reports the patient has done well since her last visit. She denies any awareness of her arrhythmia. Her daughter has checked BP and heart rate frequently and both have been within the normal range. She denies any bleeding issues on anticoagulation. She had an echo which showed preserved EF.   Today, she denies symptoms of palpitations, chest pain, shortness of breath, orthopnea, PND, lower extremity edema, dizziness, presyncope, syncope, snoring, daytime somnolence, bleeding, or neurologic sequela. The patient is tolerating medications without difficulties and is otherwise without complaint today.    Atrial Fibrillation Risk Factors:  she does not have symptoms or diagnosis of sleep apnea. she does not have a history of rheumatic fever. she does not have a history of alcohol use. The patient does not have a history of early familial atrial fibrillation or other arrhythmias.  she has a BMI of Body mass index is 25.97 kg/m.Marland Kitchen Filed Weights   11/22/19 1341  Weight: 58.3  kg    Family History  Problem Relation Age of Onset  . Heart attack Father   . Cancer Brother   . Cancer Son   . Cancer Brother      Atrial Fibrillation Management history:  Previous antiarrhythmic drugs: none Previous cardioversions: none Previous ablations: none CHADS2VASC score: 5 Anticoagulation history: Eliquis   Past Medical History:  Diagnosis Date  . Arthritis   . Carotid artery occlusion   . GERD (gastroesophageal reflux disease)   . Hyperlipidemia   . Hypertension   . Peripheral vascular disease [facility])    Past Surgical History:  Procedure Laterality Date  . CAROTID ENDARTERECTOMY    . JOINT REPLACEMENT  2003   Right knee  . KNEE SURGERY     left  . TOTAL KNEE ARTHROPLASTY     right knee    Current Outpatient Medications  Medication Sig Dispense Refill  . acetaminophen (TYLENOL) 650 MG CR tablet Take 650 mg by mouth in the morning and at bedtime. For arthritis in knees/lower back    . apixaban (ELIQUIS) 2.5 MG TABS tablet Take 1 tablet (2.5 mg total) by mouth 2 (two) times daily. 60 tablet 3  . CALCIUM-VITAMIN D PO Chews 2 chocolate pieces by mouth daily- one in the am and one in the pm    . losartan (COZAAR) 50 MG tablet Take 1 tablet by mouth daily.    . metoprolol succinate (TOPROL-XL) 50 MG 24 hr tablet Take 50 mg by mouth daily.     . Multiple Vitamin (MULTIVITAMIN) capsule Take by mouth in the morning and at bedtime.     . NON FORMULARY Pair  of compression stockings 15-62mm Hg     No current facility-administered medications for this encounter.    No Known Allergies  Social History   Socioeconomic History  . Marital status: Widowed    Spouse name: Not on file  . Number of children: Not on file  . Years of education: Not on file  . Highest education level: Not on file  Occupational History  . Not on file  Tobacco Use  . Smoking status: Never Smoker  . Smokeless tobacco: Never Used  Substance and Sexual Activity  . Alcohol use: No  .  Drug use: No  . Sexual activity: Not on file  Other Topics Concern  . Not on file  Social History Narrative  . Not on file   Social Determinants of Health   Financial Resource Strain:   . Difficulty of Paying Living Expenses:   Food Insecurity:   . Worried About Programme researcher, broadcasting/film/video in the Last Year:   . Barista in the Last Year:   Transportation Needs:   . Freight forwarder (Medical):   Marland Kitchen Lack of Transportation (Non-Medical):   Physical Activity:   . Days of Exercise per Week:   . Minutes of Exercise per Session:   Stress:   . Feeling of Stress :   Social Connections:   . Frequency of Communication with Friends and Family:   . Frequency of Social Gatherings with Friends and Family:   . Attends Religious Services:   . Active Member of Clubs or Organizations:   . Attends Banker Meetings:   Marland Kitchen Marital Status:   Intimate Partner Violence:   . Fear of Current or Ex-Partner:   . Emotionally Abused:   Marland Kitchen Physically Abused:   . Sexually Abused:      ROS- All systems are reviewed and negative except as per the HPI above.  Physical Exam: Vitals:   11/22/19 1341  BP: (!) 146/90  Pulse: 75  Weight: 58.3 kg  Height: 4\' 11"  (1.499 m)   GEN- The patient is well appearing elderly female, alert and oriented x 3 today.   HEENT-head normocephalic, atraumatic, sclera clear, conjunctiva pink, hearing intact, trachea midline. Lungs- Clear to ausculation bilaterally, normal work of breathing Heart- irregular rate and rhythm, no murmurs, rubs or gallops  GI- soft, NT, ND, + BS Extremities- no clubbing, cyanosis, or edema MS- no significant deformity or atrophy Skin- no rash or lesion Psych- euthymic mood, full affect Neuro- strength and sensation are intact   Wt Readings from Last 3 Encounters:  11/22/19 58.3 kg  10/25/19 56.9 kg  10/19/19 57.2 kg    EKG today demonstrates afib HR 75, QRS 84, QTc 431  Echo 11/02/19 1. Left ventricular ejection  fraction, by estimation, is 55 to 60%. The  left ventricle has normal function. The left ventricle has no regional  wall motion abnormalities. Left ventricular diastolic function could not  be evaluated.  2. Right ventricular systolic function is mildly reduced. The right  ventricular size is mildly enlarged. There is moderately elevated  pulmonary artery systolic pressure. The estimated right ventricular  systolic pressure is 45.9 mmHg.  3. Left atrial size was moderately dilated.  4. Right atrial size was moderately dilated.  5. The mitral valve is abnormal. Trivial mitral valve regurgitation.  6. Tricuspid valve regurgitation is moderate.  7. The aortic valve is tricuspid. Aortic valve regurgitation is not  visualized. Mild aortic valve sclerosis is present, with no evidence of  aortic valve stenosis.  8. The inferior vena cava is normal in size with <50% respiratory  variability, suggesting right atrial pressure of 8 mmHg.   Epic records are reviewed at length today  CHA2DS2-VASc Score = 5  The patient's score is based upon: CHF History: 0 HTN History: 1 Age : 2 Diabetes History: 0 Stroke History: 0 Vascular Disease History: 1 Gender: 1      ASSESSMENT AND PLAN: 1. Permanent Atrial Fibrillation The patient's CHA2DS2-VASc score is 5, indicating a 7.2% annual risk of stroke.   Patient remains in rate controlled, asymptomatic afib.  Given her age, comorbidities, and paucity of symptoms will pursue a conservative treatment with rate control. Patient and family in agreement in plan. Continue Eliquis 2.5 mg BID (age, weight <60kg). Check bmet/CBC Continue Toprol 50 mg daily Continue ASA 81 mg given h/o carotid artery disease.   2. Secondary Hypercoagulable State (ICD10:  D68.69) The patient is at significant risk for stroke/thromboembolism based upon her CHA2DS2-VASc Score of 5.  Continue Apixaban (Eliquis).   3. HTN Stable, no changes today.   Follow up in the AF  clinic in 6 months.    Jorja Loa PA-C Afib Clinic [facility] [address] [place], [place] [address] [phone] 11/22/2019 1:58 PM

## 2020-01-24 ENCOUNTER — Emergency Department (HOSPITAL_COMMUNITY): Payer: Medicare Other

## 2020-01-24 ENCOUNTER — Inpatient Hospital Stay (HOSPITAL_COMMUNITY)
Admission: EM | Admit: 2020-01-24 | Discharge: 2020-02-01 | DRG: 481 | Disposition: A | Discharge status: Skilled Nursing Facility | Payer: Medicare Other | Attending: Internal Medicine | Admitting: Internal Medicine

## 2020-01-24 ENCOUNTER — Inpatient Hospital Stay (HOSPITAL_COMMUNITY): Payer: Medicare Other

## 2020-01-24 ENCOUNTER — Encounter (HOSPITAL_COMMUNITY): Payer: Self-pay | Admitting: Emergency Medicine

## 2020-01-24 DIAGNOSIS — S72002A Fracture of unspecified part of neck of left femur, initial encounter for closed fracture: Secondary | ICD-10-CM | POA: Diagnosis present

## 2020-01-24 DIAGNOSIS — N390 Urinary tract infection, site not specified: Secondary | ICD-10-CM | POA: Diagnosis present

## 2020-01-24 DIAGNOSIS — Y92009 Unspecified place in unspecified non-institutional (private) residence as the place of occurrence of the external cause: Secondary | ICD-10-CM

## 2020-01-24 DIAGNOSIS — N179 Acute kidney failure, unspecified: Secondary | ICD-10-CM | POA: Diagnosis not present

## 2020-01-24 DIAGNOSIS — E785 Hyperlipidemia, unspecified: Secondary | ICD-10-CM | POA: Diagnosis present

## 2020-01-24 DIAGNOSIS — Z8249 Family history of ischemic heart disease and other diseases of the circulatory system: Secondary | ICD-10-CM | POA: Diagnosis not present

## 2020-01-24 DIAGNOSIS — Z79899 Other long term (current) drug therapy: Secondary | ICD-10-CM

## 2020-01-24 DIAGNOSIS — D72828 Other elevated white blood cell count: Secondary | ICD-10-CM | POA: Diagnosis present

## 2020-01-24 DIAGNOSIS — N1831 Chronic kidney disease, stage 3a: Secondary | ICD-10-CM | POA: Diagnosis present

## 2020-01-24 DIAGNOSIS — Z01818 Encounter for other preprocedural examination: Secondary | ICD-10-CM

## 2020-01-24 DIAGNOSIS — Z96651 Presence of right artificial knee joint: Secondary | ICD-10-CM | POA: Diagnosis present

## 2020-01-24 DIAGNOSIS — I272 Pulmonary hypertension, unspecified: Secondary | ICD-10-CM | POA: Diagnosis present

## 2020-01-24 DIAGNOSIS — K219 Gastro-esophageal reflux disease without esophagitis: Secondary | ICD-10-CM | POA: Diagnosis present

## 2020-01-24 DIAGNOSIS — I10 Essential (primary) hypertension: Secondary | ICD-10-CM | POA: Diagnosis not present

## 2020-01-24 DIAGNOSIS — S72142A Displaced intertrochanteric fracture of left femur, initial encounter for closed fracture: Principal | ICD-10-CM | POA: Diagnosis present

## 2020-01-24 DIAGNOSIS — W010XXA Fall on same level from slipping, tripping and stumbling without subsequent striking against object, initial encounter: Secondary | ICD-10-CM | POA: Diagnosis present

## 2020-01-24 DIAGNOSIS — I129 Hypertensive chronic kidney disease with stage 1 through stage 4 chronic kidney disease, or unspecified chronic kidney disease: Secondary | ICD-10-CM | POA: Diagnosis present

## 2020-01-24 DIAGNOSIS — Z7901 Long term (current) use of anticoagulants: Secondary | ICD-10-CM | POA: Diagnosis not present

## 2020-01-24 DIAGNOSIS — I4891 Unspecified atrial fibrillation: Secondary | ICD-10-CM

## 2020-01-24 DIAGNOSIS — H9190 Unspecified hearing loss, unspecified ear: Secondary | ICD-10-CM | POA: Diagnosis present

## 2020-01-24 DIAGNOSIS — I739 Peripheral vascular disease, unspecified: Secondary | ICD-10-CM | POA: Diagnosis present

## 2020-01-24 DIAGNOSIS — M1990 Unspecified osteoarthritis, unspecified site: Secondary | ICD-10-CM | POA: Diagnosis present

## 2020-01-24 DIAGNOSIS — F0150 Vascular dementia without behavioral disturbance: Secondary | ICD-10-CM | POA: Diagnosis present

## 2020-01-24 DIAGNOSIS — D62 Acute posthemorrhagic anemia: Secondary | ICD-10-CM | POA: Diagnosis not present

## 2020-01-24 DIAGNOSIS — J8410 Pulmonary fibrosis, unspecified: Secondary | ICD-10-CM | POA: Diagnosis not present

## 2020-01-24 DIAGNOSIS — I4821 Permanent atrial fibrillation: Secondary | ICD-10-CM | POA: Diagnosis present

## 2020-01-24 DIAGNOSIS — I444 Left anterior fascicular block: Secondary | ICD-10-CM | POA: Diagnosis present

## 2020-01-24 DIAGNOSIS — T148XXA Other injury of unspecified body region, initial encounter: Secondary | ICD-10-CM

## 2020-01-24 DIAGNOSIS — Z20822 Contact with and (suspected) exposure to covid-19: Secondary | ICD-10-CM | POA: Diagnosis present

## 2020-01-24 HISTORY — DX: Unspecified dementia, unspecified severity, without behavioral disturbance, psychotic disturbance, mood disturbance, and anxiety: F03.90

## 2020-01-24 HISTORY — DX: Chronic kidney disease, stage 3 unspecified: N18.30

## 2020-01-24 LAB — COMPREHENSIVE METABOLIC PANEL
ALT: 8 U/L (ref 0–44)
AST: 35 U/L (ref 15–41)
Albumin: 2.1 g/dL — ABNORMAL LOW (ref 3.5–5.0)
Alkaline Phosphatase: 24 U/L — ABNORMAL LOW (ref 38–126)
Anion gap: 8 (ref 5–15)
BUN: 31 mg/dL — ABNORMAL HIGH (ref 8–23)
CO2: 18 mmol/L — ABNORMAL LOW (ref 22–32)
Calcium: 6.5 mg/dL — ABNORMAL LOW (ref 8.9–10.3)
Chloride: 111 mmol/L (ref 98–111)
Creatinine, Ser: 0.79 mg/dL (ref 0.44–1.00)
GFR calc Af Amer: >60 mL/min (ref >60)
GFR calc non Af Amer: >60 mL/min (ref >60)
Glucose, Bld: 97 mg/dL (ref 70–99)
Potassium: 4.5 mmol/L (ref 3.5–5.1)
Sodium: 137 mmol/L (ref 135–145)
Total Bilirubin: 1.1 mg/dL (ref 0.3–1.2)
Total Protein: 4.2 g/dL — ABNORMAL LOW (ref 6.5–8.1)

## 2020-01-24 LAB — SARS CORONAVIRUS 2 BY RT PCR (HOSPITAL ORDER, PERFORMED IN ~~LOC~~ HOSPITAL LAB): SARS Coronavirus 2: NEGATIVE

## 2020-01-24 LAB — CBC
HCT: 38.5 % (ref 36.0–46.0)
Hemoglobin: 12.8 g/dL (ref 12.0–15.0)
MCH: 33.8 pg (ref 26.0–34.0)
MCHC: 33.2 g/dL (ref 30.0–36.0)
MCV: 101.6 fL — ABNORMAL HIGH (ref 80.0–100.0)
Platelets: 217 10*3/uL (ref 150–400)
RBC: 3.79 MIL/uL — ABNORMAL LOW (ref 3.87–5.11)
RDW: 12.6 % (ref 11.5–15.5)
WBC: 9.4 10*3/uL (ref 4.0–10.5)

## 2020-01-24 LAB — URINALYSIS, ROUTINE W REFLEX MICROSCOPIC
Bilirubin Urine: NEGATIVE
Glucose, UA: NEGATIVE mg/dL
Ketones, ur: NEGATIVE mg/dL
Nitrite: POSITIVE — AB
Protein, ur: 30 mg/dL — AB
Specific Gravity, Urine: 1.015 (ref 1.005–1.030)
pH: 5 (ref 5.0–8.0)

## 2020-01-24 LAB — PROTIME-INR
INR: 1.1 (ref 0.8–1.2)
Prothrombin Time: 13.6 seconds (ref 11.4–15.2)

## 2020-01-24 LAB — TYPE AND SCREEN
ABO/RH(D): A POS
Antibody Screen: NEGATIVE

## 2020-01-24 MED ORDER — HYDROCODONE-ACETAMINOPHEN 5-325 MG PO TABS
1.0000 | ORAL_TABLET | Freq: Four times a day (QID) | ORAL | Status: DC | PRN
  Administered 2020-01-25 – 2020-02-01 (×9): 1 via ORAL
  Filled 2020-01-24 (×9): qty 1

## 2020-01-24 MED ORDER — CALCIUM GLUCONATE-NACL 1-0.675 GM/50ML-% IV SOLN
1.0000 g | Freq: Once | INTRAVENOUS | Status: AC
  Administered 2020-01-24: 1000 mg via INTRAVENOUS
  Filled 2020-01-24: qty 50

## 2020-01-24 MED ORDER — MORPHINE SULFATE (PF) 2 MG/ML IV SOLN
0.5000 mg | INTRAVENOUS | Status: DC | PRN
  Administered 2020-01-25: 0.5 mg via INTRAVENOUS
  Filled 2020-01-24: qty 1

## 2020-01-24 MED ORDER — SODIUM CHLORIDE 0.9 % IV BOLUS
500.0000 mL | Freq: Once | INTRAVENOUS | Status: AC
  Administered 2020-01-24: 500 mL via INTRAVENOUS

## 2020-01-24 MED ORDER — FENTANYL CITRATE (PF) 100 MCG/2ML IJ SOLN
50.0000 ug | Freq: Once | INTRAMUSCULAR | Status: AC
  Administered 2020-01-24: 50 ug via INTRAVENOUS
  Filled 2020-01-24: qty 2

## 2020-01-24 MED ORDER — METOPROLOL SUCCINATE ER 25 MG PO TB24
50.0000 mg | ORAL_TABLET | Freq: Every day | ORAL | Status: DC
  Administered 2020-01-25: 50 mg via ORAL
  Filled 2020-01-24: qty 2

## 2020-01-24 MED ORDER — SODIUM CHLORIDE 0.9 % IV SOLN
1.0000 g | INTRAVENOUS | Status: DC
  Administered 2020-01-24: 1 g via INTRAVENOUS
  Filled 2020-01-24: qty 10

## 2020-01-24 MED ORDER — FENTANYL CITRATE (PF) 100 MCG/2ML IJ SOLN
25.0000 ug | Freq: Once | INTRAMUSCULAR | Status: AC
  Administered 2020-01-24: 25 ug via INTRAVENOUS
  Filled 2020-01-24: qty 2

## 2020-01-24 MED ORDER — LOSARTAN POTASSIUM 50 MG PO TABS
25.0000 mg | ORAL_TABLET | Freq: Every day | ORAL | Status: DC
  Administered 2020-01-25: 25 mg via ORAL
  Filled 2020-01-24: qty 1

## 2020-01-24 NOTE — ED Provider Notes (Signed)
[facility]-EMERGENCY DEPT [facility] Emergency Department Provider Note MRN:  [id number]  Arrival date & time: 01/25/20     Chief Complaint   Fall and Leg Injury   History of Present Illness   [name] is a 86 y.o. year-old female with a history of peripheral vascular disease, A. fib presenting to the ED with chief complaint of fall.  Patient was leaning forward attempting to stand up when she lost her balance and fell forward.  Struck her chin on the ground.  Since the fall endorsing severe left hip pain, unable to ambulate.  Pain is constant, worse with motion or palpation.  Denies any other complaints, no chest pain or shortness of breath, no dizziness preceding the fall.  Review of Systems  A complete 10 system review of systems was obtained and all systems are negative except as noted in the HPI and PMH.   Patient's Health History    Past Medical History:  Diagnosis Date  . Arthritis   . Carotid artery occlusion   . CKD (chronic kidney disease) stage 3, GFR 30-59 ml/min   . Dementia (HCC)   . GERD (gastroesophageal reflux disease)   . Hyperlipidemia   . Hypertension   . Peripheral vascular disease [facility])     Past Surgical History:  Procedure Laterality Date  . CAROTID ENDARTERECTOMY    . JOINT REPLACEMENT  2003   Right knee  . KNEE SURGERY     left  . TOTAL KNEE ARTHROPLASTY     right knee    Family History  Problem Relation Age of Onset  . Heart attack Father   . Cancer Brother   . Cancer Son   . Cancer Brother     Social History   Socioeconomic History  . Marital status: Widowed    Spouse name: Not on file  . Number of children: Not on file  . Years of education: Not on file  . Highest education level: Not on file  Occupational History  . Not on file  Tobacco Use  . Smoking status: Never Smoker  . Smokeless tobacco: Never Used  Substance and Sexual Activity  . Alcohol use: No  . Drug use: No  . Sexual activity: Not on file  Other Topics  Concern  . Not on file  Social History Narrative  . Not on file   Social Determinants of Health   Financial Resource Strain:   . Difficulty of Paying Living Expenses: Not on file  Food Insecurity:   . Worried About Programme researcher, broadcasting/film/video in the Last Year: Not on file  . Ran Out of Food in the Last Year: Not on file  Transportation Needs:   . Lack of Transportation (Medical): Not on file  . Lack of Transportation (Non-Medical): Not on file  Physical Activity:   . Days of Exercise per Week: Not on file  . Minutes of Exercise per Session: Not on file  Stress:   . Feeling of Stress : Not on file  Social Connections:   . Frequency of Communication with Friends and Family: Not on file  . Frequency of Social Gatherings with Friends and Family: Not on file  . Attends Religious Services: Not on file  . Active Member of Clubs or Organizations: Not on file  . Attends Banker Meetings: Not on file  . Marital Status: Not on file  Intimate Partner Violence:   . Fear of Current or Ex-Partner: Not on file  . Emotionally Abused:  Not on file  . Physically Abused: Not on file  . Sexually Abused: Not on file     Physical Exam   Vitals:   01/24/20 2200 01/24/20 2230  BP: (!) 143/77 (!) 132/97  Pulse: (!) 25 (!) 115  Resp: (!) 25 (!) 22  Temp:    SpO2: (!) 62% 98%    CONSTITUTIONAL: Chronically ill-appearing, NAD NEURO:  Alert and oriented x 3, no focal deficits EYES:  eyes equal and reactive ENT/NECK:  no LAD, no JVD CARDIO: Regular rate, well-perfused, normal S1 and S2 PULM:  CTAB no wheezing or rhonchi GI/GU:  normal bowel sounds, non-distended, non-tender MSK/SPINE: Left leg is shortened and externally rotated, neurovascularly intact SKIN: Small abrasion beneath the chin PSYCH:  Appropriate speech and behavior  *Additional and/or pertinent findings included in MDM below  Diagnostic and Interventional Summary    EKG Interpretation  Date/Time:  Thursday January 24 2020 18:16:54 EDT Ventricular Rate:  97 PR Interval:    QRS Duration: 149 QT Interval:  379 QTC Calculation: 462 R Axis:   -124 Text Interpretation: Atrial fibrillation IVCD, consider atypical RBBB No significant change was found Confirmed by Kennis Carina [phone]) on 01/24/2020 6:45:04 PM      Labs Reviewed  CBC - Abnormal; Notable for the following components:      Result Value   RBC 3.79 (*)    MCV 101.6 (*)    All other components within normal limits  COMPREHENSIVE METABOLIC PANEL - Abnormal; Notable for the following components:   CO2 18 (*)    BUN 31 (*)    Calcium 6.5 (*)    Total Protein 4.2 (*)    Albumin 2.1 (*)    Alkaline Phosphatase 24 (*)    All other components within normal limits  URINALYSIS, ROUTINE W REFLEX MICROSCOPIC - Abnormal; Notable for the following components:   Color, Urine AMBER (*)    APPearance CLOUDY (*)    Hgb urine dipstick LARGE (*)    Protein, ur 30 (*)    Nitrite POSITIVE (*)    Leukocytes,Ua SMALL (*)    Bacteria, UA MANY (*)    All other components within normal limits  SARS CORONAVIRUS 2 BY RT PCR (HOSPITAL ORDER, PERFORMED IN [facility])  URINE CULTURE  PROTIME-INR  TYPE AND SCREEN    Chest Portable 1 View  Final Result    CT Head Wo Contrast  Final Result    DG Pelvis Portable  Final Result    DG Femur 1 View Left  Final Result      Medications  HYDROcodone-acetaminophen (NORCO/VICODIN) 5-325 MG per tablet 1-2 tablet (has no administration in time range)  morphine 2 MG/ML injection 0.5 mg (has no administration in time range)  cefTRIAXone (ROCEPHIN) 1 g in sodium chloride 0.9 % 100 mL IVPB (1 g Intravenous New Bag/Given 01/24/20 2239)  metoprolol succinate (TOPROL-XL) 24 hr tablet 50 mg (has no administration in time range)  losartan (COZAAR) tablet 25 mg (has no administration in time range)  fentaNYL (SUBLIMAZE) injection 25 mcg (25 mcg Intravenous Given 01/24/20 1854)  fentaNYL (SUBLIMAZE) injection 50 mcg  (50 mcg Intravenous Given 01/24/20 2035)  sodium chloride 0.9 % bolus 500 mL (0 mLs Intravenous Stopped 01/24/20 2138)  calcium gluconate 1 g/ 50 mL sodium chloride IVPB (0 g Intravenous Stopped 01/24/20 2237)     Procedures  /  Critical Care .Critical Care Performed by: Sabas Sous, MD Authorized by: Sabas Sous, MD  Critical care provider statement:    Critical care time (minutes):  32   Critical care was necessary to treat or prevent imminent or life-threatening deterioration of the following conditions: A. fib with RVR.   Critical care was time spent personally by me on the following activities:  Discussions with consultants, evaluation of patient's response to treatment, examination of patient, ordering and performing treatments and interventions, ordering and review of laboratory studies, ordering and review of radiographic studies, pulse oximetry, re-evaluation of patient's condition, obtaining history from patient or surrogate and review of old charts    ED Course and Medical Decision Making  I have reviewed the triage vital signs, the nursing notes, and pertinent available records from the EMR.  Listed above are laboratory and imaging tests that I personally ordered, reviewed, and interpreted and then considered in my medical decision making (see below for details).  Mechanical fall with suspected hip or femur fracture, imaging is pending.     X-ray confirms fracture, admitted to hospital service for further care.  Orthopedics will follow in consultation.  12 AM update: Developing A. fib with RVR, has known A. fib.  Daughter at bedside now explains that patient was diagnosed with a UTI and she was supposed to pick up her antibiotics today.  Hospital service made aware.  Providing IV fluids.  Elmer Sow. Pilar Plate, MD [facility] Emergency Medicine [facility] [email or web address]  Final Clinical Impressions(s) / ED Diagnoses     ICD-10-CM   1. Closed  displaced intertrochanteric fracture of left femur, initial encounter (HCC)  S72.142A   2. Pre-op evaluation  Z01.818 Chest Portable 1 View    Chest Portable 1 View  3. Atrial fibrillation with RVR (HCC)  I48.91     ED Discharge Orders    None       Discharge Instructions Discussed with and Provided to Patient:   Discharge Instructions   None       Sabas Sous, MD 01/25/20 [phone]

## 2020-01-24 NOTE — H&P (Signed)
History and Physical    [name] Zacher XBM:[id number] DOB: 05/06/1933 DOA: 01/24/2020  PCP: Delorse LekBurnett, Brent A, MD  Patient coming from: Home  I have personally briefly reviewed patient'[name] old medical records in [facility]  Chief Complaint: Fall, hip pain  HPI: [name] Baldo is a 86 y.o. female with medical history significant of CKD 3, HTN, vascular dementia, A.Fib on eliquis, bilateral CEAs.  Pt has been having urinary incontinence for past 2 weeks.  Went to PCP today, diagnosed with UTI (large LE, positive nitrites), bactrim ordered.  While on way to get bactrim, pt had mechancial fall.  Severe L hip pain and deformity following fall, unable to bear wt.  Pt brought to ED.   ED Course: L intertroch fx.   Review of Systems: As per HPI, otherwise all review of systems negative.  Past Medical History:  Diagnosis Date  . Arthritis   . Carotid artery occlusion   . CKD (chronic kidney disease) stage 3, GFR 30-59 ml/min   . Dementia (HCC)   . GERD (gastroesophageal reflux disease)   . Hyperlipidemia   . Hypertension   . Peripheral vascular disease [name](HCC)     Past Surgical History:  Procedure Laterality Date  . CAROTID ENDARTERECTOMY    . JOINT REPLACEMENT  2003   Right knee  . KNEE SURGERY     left  . TOTAL KNEE ARTHROPLASTY     right knee     reports that she has never smoked. She has never used smokeless tobacco. She reports that she does not drink alcohol and does not use drugs.  No Known Allergies  Family History  Problem Relation Age of Onset  . Heart attack Father   . Cancer Brother   . Cancer Son   . Cancer Brother      Prior to Admission medications   Medication Sig Start Date End Date Taking? Authorizing Provider  apixaban (ELIQUIS) 2.5 MG TABS tablet Take 1 tablet (2.5 mg total) by mouth 2 (two) times daily. 10/25/19 01/24/20 Yes Fenton, Clint R, PA  losartan (COZAAR) 50 MG tablet Take 50 mg by mouth daily.  10/31/19  Yes [provider]    metoprolol succinate (TOPROL-XL) 50 MG 24 hr tablet Take 50 mg by mouth daily.    Yes [provider]  acetaminophen (TYLENOL) 650 MG CR tablet Take 650 mg by mouth in the morning and at bedtime. For arthritis in knees/lower back    [provider]  CALCIUM-VITAMIN D PO Chews 2 chocolate pieces by mouth daily- one in the am and one in the pm    [provider]  Multiple Vitamin (MULTIVITAMIN) capsule Take by mouth in the morning and at bedtime.     [provider]  NON FORMULARY Pair of compression stockings 15-1220mm Hg 05/24/19   [provider]    Physical Exam: Vitals:   01/24/20 1814 01/24/20 1816  BP:  140/84  Pulse:  91  Resp:  18  Temp:  (!) 97.4 F (36.3 C)  TempSrc:  Oral  SpO2: 94% 94%    Constitutional: NAD, calm, comfortable Eyes: PERRL, lids and conjunctivae normal ENMT: Mucous membranes are moist. Posterior pharynx clear of any exudate or lesions.Normal dentition.  Neck: normal, supple, no masses, no thyromegaly Respiratory: clear to auscultation bilaterally, no wheezing, no crackles. Normal respiratory effort. No accessory muscle use.  Cardiovascular: Regular rate and rhythm, no murmurs / rubs / gallops. No extremity edema. 2+ pedal pulses. No carotid  bruits.  Abdomen: no tenderness, no masses palpated. No hepatosplenomegaly. Bowel sounds positive.  Musculoskeletal: L hip TTP Skin: no rashes, lesions, ulcers. No induration Neurologic: CN 2-12 grossly intact. Sensation intact, DTR normal. Strength 5/5 in all 4.  Psychiatric: Normal judgment and insight. Alert and oriented x 3. Normal mood.    Labs on Admission: I have personally reviewed following labs and imaging studies  CBC: Recent Labs  Lab 01/24/20 1836  WBC 9.4  HGB 12.8  HCT 38.5  MCV 101.6*  PLT 217   Basic Metabolic Panel: Recent Labs  Lab 01/24/20 1950  NA 137  K 4.5  CL 111  CO2 18*  GLUCOSE 97  BUN 31*  CREATININE 0.79  CALCIUM 6.5*    GFR: CrCl cannot be calculated (Unknown ideal weight.). Liver Function Tests: Recent Labs  Lab 01/24/20 1950  AST 35  ALT 8  ALKPHOS 24*  BILITOT 1.1  PROT 4.2*  ALBUMIN 2.1*   No results for input([name]): LIPASE, AMYLASE in the last 168 hours. No results for input([name]): AMMONIA in the last 168 hours. Coagulation Profile: Recent Labs  Lab 01/24/20 1836  INR 1.1   Cardiac Enzymes: No results for input([name]): CKTOTAL, CKMB, CKMBINDEX, TROPONINI in the last 168 hours. BNP (last 3 results) No results for input([name]): PROBNP in the last 8760 hours. HbA1C: No results for input([name]): HGBA1C in the last 72 hours. CBG: No results for input([name]): GLUCAP in the last 168 hours. Lipid Profile: No results for input([name]): CHOL, HDL, LDLCALC, TRIG, CHOLHDL, LDLDIRECT in the last 72 hours. Thyroid Function Tests: No results for input([name]): TSH, T4TOTAL, FREET4, T3FREE, THYROIDAB in the last 72 hours. Anemia Panel: No results for input([name]): VITAMINB12, FOLATE, FERRITIN, TIBC, IRON, RETICCTPCT in the last 72 hours. Urine analysis:    Component Value Date/Time   COLORURINE YELLOW 09/30/2010 0855   APPEARANCEUR CLEAR 09/30/2010 0855   LABSPEC 1.022 09/30/2010 0855   PHURINE 5.0 09/30/2010 0855   GLUCOSEU NEGATIVE 09/30/2010 0855   HGBUR SMALL (A) 09/30/2010 0855   BILIRUBINUR NEGATIVE 09/30/2010 0855   KETONESUR NEGATIVE 09/30/2010 0855   PROTEINUR NEGATIVE 09/30/2010 0855   UROBILINOGEN 0.2 09/30/2010 0855   NITRITE NEGATIVE 09/30/2010 0855   LEUKOCYTESUR SMALL (A) 09/30/2010 0855    Radiological Exams on Admission: CT Head Wo Contrast  Result Date: 01/24/2020 CLINICAL DATA:  Fall, head injury EXAM: CT HEAD WITHOUT CONTRAST TECHNIQUE: Contiguous axial images were obtained from the base of the skull through the vertex without intravenous contrast. COMPARISON:  02/09/2017 FINDINGS: Brain: Normal anatomic configuration. Mild parenchymal volume loss is commensurate with the patient'[name] age. Moderate  periventricular white matter changes are present likely reflecting the sequela of small vessel ischemia, stable since prior examination. No abnormal intra or extra-axial mass lesion or fluid collection. No abnormal mass effect or midline shift. No evidence of acute intracranial hemorrhage or infarct. Ventricular size is normal. Cerebellum unremarkable. Vascular: There is extensive atherosclerotic calcification involving the carotid siphons. No hyperdense asymmetric vasculature at the skull base. Skull: Intact Sinuses/Orbits: Paranasal sinuses are clear. Orbits are unremarkable. Other: Mastoid air cells and middle ear cavities are clear. z 9 mm nodule within the left temporal scalp is nonspecific, but appears stable since prior examination. IMPRESSION: 1. No evidence of acute intracranial abnormality. Electronically Signed   By: Helyn Numbers MD   On: 01/24/2020 19:56   DG Pelvis Portable  Result Date: 01/24/2020 CLINICAL DATA:  Fall, possible hip fracture. EXAM: PORTABLE PELVIS 1-2 VIEWS COMPARISON:  CT abdomen pelvis 10/10/2013  FINDINGS: Mildly displaced, comminuted and slightly valgus angulated intertrochanteric left femur fracture. Remaining bones of the pelvis and proximal right femur are intact and congruent. Femoral heads remain normally located. Background of mild degenerative changes in the lower lumbar spine, pelvis and hips. Asymmetric left hip and proximal thigh swelling. Vascular calcium noted in the soft tissues. IMPRESSION: 1. Mildly displaced, comminuted and slightly valgus angulated intertrochanteric left femur fracture. 2. Remaining bones of the pelvis and proximal right femur are intact. Electronically Signed   By: Kreg Shropshire M.D.   On: 01/24/2020 19:06   DG Femur 1 View Left  Result Date: 01/24/2020 CLINICAL DATA:  Fall, left hip pain EXAM: LEFT FEMUR 1 VIEW COMPARISON:  None. FINDINGS: Single view radiograph left hip demonstrates a a probable impacted intertrochanteric left hip fracture.  Left femoral head is seated within the left acetabulum and there is mild joint space narrowing in keeping with at least mild left hip degenerative arthritis. Visualized pelvis appears intact. Degenerative changes are seen within the left knee, not well profiled on this examination. Extensive vascular calcifications are noted. IMPRESSION: Suspected intratrochanteric left hip fracture. Dedicated two view radiograph or CT examination is recommended for further evaluation. Electronically Signed   By: Helyn Numbers MD   On: 01/24/2020 19:07    EKG: Independently reviewed.  Assessment/Plan Principal Problem:   Closed left hip fracture, initial encounter [facility]) Active Problems:   Permanent atrial fibrillation (HCC)   Acute lower UTI   HTN (hypertension)    1. L hip fx - 1. Hip fx pathway 2. Pain ctrl per pathway 3. NPO after MN until we know timing of surgery 4. Ortho surg consulted 5. Holding eliquis 6. EKG: A.Fib, unchanged from July 7. CXR pending 8. Gupta Score 1.7% 2. A.Fib - 1. Holding eliquis 2. Cont metoprolol 3. UTI - 1. Rocephin 2. UCx ordered and pending 4. HTN - 1. Cont home BP meds when med rec completed  DVT prophylaxis: SCDs Code Status: Full Family Communication: Daughter at bedside Disposition Plan: SNF after admit Consults called: Ortho surgery called by EDP Admission status: Admit to inpatient  Severity of Illness: The appropriate patient status for this patient is INPATIENT. Inpatient status is judged to be reasonable and necessary in order to provide the required intensity of service to ensure the patient'[name] safety. The patient'[name] presenting symptoms, physical exam findings, and initial radiographic and laboratory data in the context of their chronic comorbidities is felt to place them at high risk for further clinical deterioration. Furthermore, it is not anticipated that the patient will be medically stable for discharge from the hospital within 2 midnights of  admission. The following factors support the patient status of inpatient.   IP status due to hip fracture needing OR repair.  * I certify that at the point of admission it is my clinical judgment that the patient will require inpatient hospital care spanning beyond 2 midnights from the point of admission due to high intensity of service, high risk for further deterioration and high frequency of surveillance required.*    Deon Ivey M. DO [facility]  How to contact the [facility] Attending or Consulting provider 7A - 7P or covering provider during after hours 7P -7A, for this patient?  1. Check the care team in [facility] and look for a) attending/consulting [facility] provider listed and b) the [facility] team listed 2. Log into [email or web address]  Amion Physician Scheduling and messaging for groups and whole hospitals  On call and physician scheduling software for  group practices, residents, hospitalists and other medical providers for call, clinic, rotation and shift schedules. OnCall Enterprise is a hospital-wide system for scheduling doctors and paging doctors on call. EasyPlot is for scientific plotting and data analysis.  [email or web address]  and use [name] universal password to access. If you do not have the password, please contact the hospital operator.  3. Locate the [facility] provider you are looking for under [facility] and page to a number that you can be directly reached. 4. If you still have difficulty reaching the provider, please page the [facility] (Director on Call) for the Hospitalists listed on amion for assistance.  01/24/2020, 9:48 PM

## 2020-01-24 NOTE — ED Triage Notes (Addendum)
Pt arrived via [facility]. Pt has c/o of left hip/femur pain pain. Pt was leaning over and fell over. Unknown if pt is taking blood thinners. On route pt received 75 mcg of fentanyl.

## 2020-01-24 NOTE — Progress Notes (Signed)
Spoke with Dr. Victorino Dike.  As I suspected and told family, pt being on eliquis will delay surgery.  Current plan is for surgery Sat.  Will therefore let pt eat and make NPO after MN on Sat.

## 2020-01-24 NOTE — Consult Note (Signed)
Reason for Consult: Left hip pain  referring Physician: Dr. Dorita Fray SHANNIN NAAB is an 86 y.o. female.  HPI: Patient is an 86 year old female with a past medical history significant for chronic kidney disease and dementia.  She fell at home today while trying to get up and get her cane.  Her daughter was with her.  She was unable to bear weight afterwards.  She came to the emergency room at [facility] via EMS.  Radiographs reveal an intertrochanteric fracture that is displaced.  Her daughter and daughter-in-law are her power of attorney.  She lives alone with sons who are nearby and check on her regularly.  She is not a smoker.  No history of diabetes.  Past Medical History:  Diagnosis Date  . Arthritis   . Carotid artery occlusion   . CKD (chronic kidney disease) stage 3, GFR 30-59 ml/min   . Dementia (HCC)   . GERD (gastroesophageal reflux disease)   . Hyperlipidemia   . Hypertension   . Peripheral vascular disease [facility])     Past Surgical History:  Procedure Laterality Date  . CAROTID ENDARTERECTOMY    . JOINT REPLACEMENT  2003   Right knee  . KNEE SURGERY     left  . TOTAL KNEE ARTHROPLASTY     right knee    Family History  Problem Relation Age of Onset  . Heart attack Father   . Cancer Brother   . Cancer Son   . Cancer Brother     Social History:  reports that she has never smoked. She has never used smokeless tobacco. She reports that she does not drink alcohol and does not use drugs.  Allergies: No Known Allergies  Medications: I have reviewed the patient's current medications.  Results for orders placed or performed during the hospital encounter of 01/24/20 (from the past 48 hour(s))  Type and screen [facility]     Status: None   Collection Time: 01/24/20  6:20 PM  Result Value Ref Range   ABO/RH(D) A POS    Antibody Screen NEG    Sample Expiration      01/27/2020,2359 Performed at [facility], [address], [place],  [place] [address]   CBC     Status: Abnormal   Collection Time: 01/24/20  6:36 PM  Result Value Ref Range   WBC 9.4 4.0 - 10.5 K/uL   RBC 3.79 (L) 3.87 - 5.11 MIL/uL   Hemoglobin 12.8 12.0 - 15.0 g/dL   HCT 46.9 36 - 46 %   MCV 101.6 (H) 80.0 - 100.0 fL   MCH 33.8 26.0 - 34.0 pg   MCHC 33.2 30.0 - 36.0 g/dL   RDW 62.9 52.8 - 41.3 %   Platelets 217 150 - 400 K/uL    Comment: Performed at [facility], [address] [place], [place] [address]  Protime-INR     Status: None   Collection Time: 01/24/20  6:36 PM  Result Value Ref Range   Prothrombin Time 13.6 11.4 - 15.2 seconds   INR 1.1 0.8 - 1.2    Comment: (NOTE) INR goal varies based on device and disease states. Performed at [facility], [address] [place], [place] [address]   SARS Coronavirus 2 by RT PCR (hospital order, performed in [facility] hospital lab) Nasopharyngeal Nasopharyngeal Swab     Status: None   Collection Time: 01/24/20  6:55 PM   Specimen: Nasopharyngeal Swab  Result Value Ref Range   SARS Coronavirus 2 NEGATIVE NEGATIVE    Comment: (NOTE) SARS-CoV-2 target nucleic acids are NOT DETECTED.  The SARS-CoV-2 RNA is generally detectable in upper and lower respiratory specimens during the acute phase of infection. The lowest concentration of SARS-CoV-2 viral copies this assay can detect is 250 copies / mL. A negative result does not preclude SARS-CoV-2 infection and should not be used as the sole basis for treatment or other patient management decisions.  A negative result may occur with improper specimen collection / handling, submission of specimen other than nasopharyngeal swab, presence of viral mutation(s) within the areas targeted by this assay, and inadequate number of viral copies (<250 copies / mL). A negative result must be combined with clinical observations, patient history, and epidemiological information.  Fact Sheet for Patients:   BoilerBrush.com.cyhttps://www.fda.gov/media/136312/download  Fact  Sheet for Healthcare Providers: https://pope.com/https://www.fda.gov/media/136313/download  This test is not yet approved or  cleared by the [place] FDA and has been authorized for detection and/or diagnosis of SARS-CoV-2 by FDA under an Emergency Use Authorization (EUA).  This EUA will remain in effect (meaning this test can be used) for the duration of the COVID-19 declaration under Section 564(b)(1) of the Act, 21 U.S.C. section 360bbb-3(b)(1), unless the authorization is terminated or revoked sooner.  Performed at [facility], [address] [place], [place] [address]   Comprehensive metabolic panel     Status: Abnormal   Collection Time: 01/24/20  7:50 PM  Result Value Ref Range   Sodium 137 135 - 145 mmol/L   Potassium 4.5 3.5 - 5.1 mmol/L   Chloride 111 98 - 111 mmol/L   CO2 18 (L) 22 - 32 mmol/L   Glucose, Bld 97 70 - 99 mg/dL    Comment: Glucose reference range applies only to samples taken after fasting for at least 8 hours.   BUN 31 (H) 8 - 23 mg/dL   Creatinine, Ser 6.040.79 0.44 - 1.00 mg/dL   Calcium 6.5 (L) 8.9 - 10.3 mg/dL   Total Protein 4.2 (L) 6.5 - 8.1 g/dL   Albumin 2.1 (L) 3.5 - 5.0 g/dL   AST 35 15 - 41 U/L   ALT 8 0 - 44 U/L   Alkaline Phosphatase 24 (L) 38 - 126 U/L   Total Bilirubin 1.1 0.3 - 1.2 mg/dL   GFR calc non Af Amer >60 >60 mL/min   GFR calc Af Amer >60 >60 mL/min   Anion gap 8 5 - 15    Comment: Performed at [facility], [address] [place], [place] [address]  Urinalysis, Routine w reflex microscopic     Status: Abnormal   Collection Time: 01/24/20 11:07 PM  Result Value Ref Range   Color, Urine AMBER (A) YELLOW    Comment: BIOCHEMICALS MAY BE AFFECTED BY COLOR   APPearance CLOUDY (A) CLEAR   Specific Gravity, Urine 1.015 1.005 - 1.030   pH 5.0 5.0 - 8.0   Glucose, UA NEGATIVE NEGATIVE mg/dL   Hgb urine dipstick LARGE (A) NEGATIVE   Bilirubin Urine NEGATIVE NEGATIVE   Ketones, ur NEGATIVE NEGATIVE mg/dL   Protein, ur 30 (A)  NEGATIVE mg/dL   Nitrite POSITIVE (A) NEGATIVE   Leukocytes,Ua SMALL (A) NEGATIVE   RBC / HPF 6-10 0 - 5 RBC/hpf   WBC, UA 11-20 0 - 5 WBC/hpf   Bacteria, UA MANY (A) NONE SEEN   Squamous Epithelial / LPF 0-5 0 - 5    Comment:  Performed at [facility], [address] [place], [place] [address]    CT Head Wo Contrast  Result Date: 01/24/2020 CLINICAL DATA:  Fall, head injury EXAM: CT HEAD WITHOUT CONTRAST TECHNIQUE: Contiguous axial images were obtained from the base of the skull through the vertex without intravenous contrast. COMPARISON:  02/09/2017 FINDINGS: Brain: Normal anatomic configuration. Mild parenchymal volume loss is commensurate with the patient's age. Moderate periventricular white matter changes are present likely reflecting the sequela of small vessel ischemia, stable since prior examination. No abnormal intra or extra-axial mass lesion or fluid collection. No abnormal mass effect or midline shift. No evidence of acute intracranial hemorrhage or infarct. Ventricular size is normal. Cerebellum unremarkable. Vascular: There is extensive atherosclerotic calcification involving the carotid siphons. No hyperdense asymmetric vasculature at the skull base. Skull: Intact Sinuses/Orbits: Paranasal sinuses are clear. Orbits are unremarkable. Other: Mastoid air cells and middle ear cavities are clear. z 9 mm nodule within the left temporal scalp is nonspecific, but appears stable since prior examination. IMPRESSION: 1. No evidence of acute intracranial abnormality. Electronically Signed   By: Helyn Numbers MD   On: 01/24/2020 19:56   DG Pelvis Portable  Result Date: 01/24/2020 CLINICAL DATA:  Fall, possible hip fracture. EXAM: PORTABLE PELVIS 1-2 VIEWS COMPARISON:  CT abdomen pelvis 10/10/2013 FINDINGS: Mildly displaced, comminuted and slightly valgus angulated intertrochanteric left femur fracture. Remaining bones of the pelvis and proximal right femur are intact and congruent. Femoral  heads remain normally located. Background of mild degenerative changes in the lower lumbar spine, pelvis and hips. Asymmetric left hip and proximal thigh swelling. Vascular calcium noted in the soft tissues. IMPRESSION: 1. Mildly displaced, comminuted and slightly valgus angulated intertrochanteric left femur fracture. 2. Remaining bones of the pelvis and proximal right femur are intact. Electronically Signed   By: Kreg Shropshire M.D.   On: 01/24/2020 19:06   Chest Portable 1 View  Result Date: 01/24/2020 CLINICAL DATA:  Preop EXAM: PORTABLE CHEST 1 VIEW COMPARISON:  10/19/2019, CT 04/11/2012 FINDINGS: Diffuse bilateral interstitial and reticular opacity consistent with pulmonary fibrosis. No acute consolidation or effusion. Cardiomegaly with aortic atherosclerosis. No pneumothorax. IMPRESSION: Cardiomegaly with pulmonary fibrosis.  No acute airspace disease. Electronically Signed   By: Jasmine Pang M.D.   On: 01/24/2020 22:03   DG Femur 1 View Left  Result Date: 01/24/2020 CLINICAL DATA:  Fall, left hip pain EXAM: LEFT FEMUR 1 VIEW COMPARISON:  None. FINDINGS: Single view radiograph left hip demonstrates a a probable impacted intertrochanteric left hip fracture. Left femoral head is seated within the left acetabulum and there is mild joint space narrowing in keeping with at least mild left hip degenerative arthritis. Visualized pelvis appears intact. Degenerative changes are seen within the left knee, not well profiled on this examination. Extensive vascular calcifications are noted. IMPRESSION: Suspected intratrochanteric left hip fracture. Dedicated two view radiograph or CT examination is recommended for further evaluation. Electronically Signed   By: Helyn Numbers MD   On: 01/24/2020 19:07    ROS: No recent fever, chills, nausea, vomiting or changes in her appetite.  10 system review was otherwise negative.  Recent urinary incontinence being evaluated by her PCP. PE:  Blood pressure (!) 132/97, pulse  (!) 115, temperature (!) 97.4 F (36.3 C), temperature source Oral, resp. rate (!) 22, SpO2 98 %. Thin chronically ill-appearing elderly female in no apparent distress.  Alert.  Oriented to person.  Extraocular motions are intact.  Respirations are unlabored.  Left lower extremity shortened  and externally rotated.  Pain with passive internal and external rotation.  1+ dorsalis pedis pulse.  Intact sensibility to light touch dorsally and plantarly at the forefoot.  Assessment/Plan: Left hip intertrochanteric fracture -I explained the nature of the injury to the patient's daughter in detail.  She takes blood thinners at base line.  She will need operative fixation of the left hip fracture after her a blood thinners are held for approximately 2 days.  We will plan for surgery Saturday.  Patient's daughter understands plan and agrees.  I spoke with Dr. Julian Reil to update him as well.  Toni Arthurs 01/24/2020, 11:41 PM

## 2020-01-25 ENCOUNTER — Other Ambulatory Visit (HOSPITAL_COMMUNITY): Payer: Self-pay | Admitting: Orthopedic Surgery

## 2020-01-25 DIAGNOSIS — S72002A Fracture of unspecified part of neck of left femur, initial encounter for closed fracture: Secondary | ICD-10-CM

## 2020-01-25 DIAGNOSIS — J8410 Pulmonary fibrosis, unspecified: Secondary | ICD-10-CM

## 2020-01-25 DIAGNOSIS — I4821 Permanent atrial fibrillation: Secondary | ICD-10-CM

## 2020-01-25 DIAGNOSIS — N390 Urinary tract infection, site not specified: Secondary | ICD-10-CM

## 2020-01-25 DIAGNOSIS — I10 Essential (primary) hypertension: Secondary | ICD-10-CM

## 2020-01-25 MED ORDER — CEFAZOLIN SODIUM-DEXTROSE 2-4 GM/100ML-% IV SOLN
2.0000 g | INTRAVENOUS | Status: AC
  Administered 2020-01-26: 2 g via INTRAVENOUS
  Filled 2020-01-25: qty 100

## 2020-01-25 MED ORDER — ACETAMINOPHEN 325 MG PO TABS
650.0000 mg | ORAL_TABLET | Freq: Four times a day (QID) | ORAL | Status: DC
  Administered 2020-01-25 – 2020-02-01 (×24): 650 mg via ORAL
  Filled 2020-01-25 (×27): qty 2

## 2020-01-25 MED ORDER — POVIDONE-IODINE 10 % EX SWAB
2.0000 "application " | Freq: Once | CUTANEOUS | Status: AC
  Administered 2020-01-25: 2 via TOPICAL

## 2020-01-25 MED ORDER — DILTIAZEM HCL-DEXTROSE 125-5 MG/125ML-% IV SOLN (PREMIX)
5.0000 mg/h | INTRAVENOUS | Status: DC
  Administered 2020-01-25: 5 mg/h via INTRAVENOUS
  Filled 2020-01-25 (×2): qty 125

## 2020-01-25 MED ORDER — SULFAMETHOXAZOLE-TRIMETHOPRIM 800-160 MG PO TABS
1.0000 | ORAL_TABLET | Freq: Two times a day (BID) | ORAL | Status: AC
  Administered 2020-01-25 – 2020-01-27 (×6): 1 via ORAL
  Filled 2020-01-25 (×6): qty 1

## 2020-01-25 MED ORDER — DILTIAZEM LOAD VIA INFUSION
10.0000 mg | Freq: Once | INTRAVENOUS | Status: AC
  Administered 2020-01-25: 10 mg via INTRAVENOUS
  Filled 2020-01-25: qty 10

## 2020-01-25 MED ORDER — CHLORHEXIDINE GLUCONATE 4 % EX LIQD
60.0000 mL | Freq: Once | CUTANEOUS | Status: AC
  Administered 2020-01-26: 4 via TOPICAL
  Filled 2020-01-25: qty 60

## 2020-01-25 MED ORDER — CHLORHEXIDINE GLUCONATE 4 % EX LIQD: 60.0000 mL | Freq: Once | CUTANEOUS | Status: DC

## 2020-01-25 MED ORDER — METOPROLOL TARTRATE 25 MG PO TABS
25.0000 mg | ORAL_TABLET | Freq: Three times a day (TID) | ORAL | Status: DC
  Administered 2020-01-25 (×2): 25 mg via ORAL
  Filled 2020-01-25 (×2): qty 1

## 2020-01-25 NOTE — Plan of Care (Signed)
  Problem: Health Behavior/Discharge Planning: Goal: Ability to manage health-related needs will improve Outcome: Progressing   Problem: Clinical Measurements: Goal: Ability to maintain clinical measurements within normal limits will improve Outcome: Progressing   Problem: Activity: Goal: Risk for activity intolerance will decrease Outcome: Progressing   Problem: Nutrition: Goal: Adequate nutrition will be maintained Outcome: Progressing   Problem: Coping: Goal: Level of anxiety will decrease Outcome: Progressing   Problem: Elimination: Goal: Will not experience complications related to bowel motility Outcome: Progressing   Problem: Pain Managment: Goal: General experience of comfort will improve Outcome: Progressing   Problem: Safety: Goal: Ability to remain free from injury will improve Outcome: Progressing   Problem: Skin Integrity: Goal: Risk for impaired skin integrity will decrease Outcome: Progressing  Pt's son at bedside, educated about visitation policy and he states understanding.

## 2020-01-25 NOTE — ED Notes (Signed)
Breakfast ordered 

## 2020-01-25 NOTE — Significant Event (Addendum)
Pt's A.Fib has gone into RVR, per RN HR running 130-160 (the low HR readings of 25 and 38 documented in the chart are not accurate).  Will put pt on cardizem gtt for rate control.  Bed req changed to cardiac tele.

## 2020-01-25 NOTE — Progress Notes (Addendum)
PROGRESS NOTE    [name]  XIP:[id number] DOB: 1932/11/21 DOA: 01/24/2020 PCP: Delorse Lek, MD    Brief Narrative:  Patient admitted to the hospital with a working diagnosis of left intertrochanteric fracture.  86 year old female past medical history for hypertension, chronic kidney disease stage III, vascular dementia, atrial fibrillation, pulmonary fibrosis and history of carotid artery disease.  Patient reported urinary incontinence for about 2 weeks, as an outpatient she was diagnosed with urine tract infection, she was placed on Bactrim.  She sustained a mechanical fall from her own height while walking.  Post trauma she had severe left hip pain, left lower extremity deformity, and she was unable to bear weight or ambulate.  On her initial physical examination blood pressure 140/84, heart rate 91, respiratory rate 18, temperature 97.4, oxygen saturation 94%.  Her lungs are clear to auscultation bilaterally, heart S1-S2, present rhythmic, soft abdomen, no lower extremity edema, she had left hip tenderness.  Sodium 137, potassium 4.5, chloride 111, bicarb 18, glucose 97, BUN 31, creatinine 0.79, white count 9.4, hemoglobin 12.8, hematocrit 38.5, platelets 217.  SARS COVID-19 negative.  Urinalysis 11-20 white cells, 6-10 red cells, specific gravity 1.015, positive nitrates.  Head CT no acute changes.  Pelvic x-ray with mildly displaced, comminuted and slightly valgus angulated intertrochanteric left femur fracture.  Chest radiograph with bilateral increased lung markings/fibrosis. EKG 97 bpm, left axis deviation, left anterior fascicular block, normal QTC, atrial fibrillation rhythm, no ST segment or T wave changes.  Assessment & Plan:   Principal Problem:   Closed left hip fracture, initial encounter [facility]) Active Problems:   Permanent atrial fibrillation (HCC)   Acute lower UTI   HTN (hypertension)   1. Acute intertrochanteric left femur fracture. Patient continue to have pain  at the left hip.  Continue pain control with hydrocodone and morphine. Will add scheduled acetaminophen.  Holding apixaban for now in preparation for surgical intervention in am. Patient will be NPO past midnight.   Patient's mobility is impaired due to ambulatory dysfunction, at baseline with no angina. Patient has moderate cardiovascular risk for orthopedic procedure.   2. Chronic permanent atrial fibrillation. Patient had NEW RVR and placed on diltiazem infusion, currently at 7,5 mg per H with HR in the 80's.  Echocardiogram from 06/21 with preserved LV systolic function 55 to 60% with mild reduction in RV systolic pressure.   Keep heart rate less than 130 bpm. Continue to hold on anticoagulation for now in preparation for surgical procedure in am. Continue with telemetry monitoring.   3. Urinary tract infection. Will change to dc ceftriaxone and will change to bactrim for 3 days duration. Follow up on urine cultures.   4. Chronic pulmonary fibrosis. Patient not on oxygen at home, will continue close oxymetry monitoring. Avoid volume overload.  Patient with increased RV systolic pressure and mild reduction in RV systolic function, consistent with pulmonary hypertension.   5. HTN. Blood pressure 144/89 and 143/71, hold on losartan to prevent hypotension.      Patient continue to be at high risk for worsening atrial fibrillation   Status is: Inpatient  Remains inpatient appropriate because:IV treatments appropriate due to intensity of illness or inability to take PO   Dispo: The patient is from: Home              Anticipated d/c is to: SNF              Anticipated d/c date is: 3 days  Patient currently is not medically stable to d/c.   DVT prophylaxis: apixaban (on hold)   Code Status:    full  Family Communication:  I spoke with patient's daughter and grandaughter at the bedside, we talked in detail about patient's condition, plan of care and prognosis and all  questions were addressed.       Consultants:   Orthopedics     Subjective: Patient with no chest pain or dyspnea, no nausea or vomiting, continue to have left hip pain, worse with movement.   Objective: Vitals:   01/25/20 1000 01/25/20 1030 01/25/20 1100 01/25/20 1145  BP: (!) 140/100 (!) 152/85 (!) 144/71 (!) 143/90  Pulse: 77 73 66 (!) 54  Resp: (!) 22 (!) 23 (!) 27 (!) 22  Temp:      TempSrc:      SpO2: 97% 95% 93% 93%    Intake/Output Summary (Last 24 hours) at 01/25/2020 1328 Last data filed at 01/24/2020 2237 Gross per 24 hour  Intake 545.23 ml  Output --  Net 545.23 ml   There were no vitals filed for this visit.  Examination:   General: Not in pain or dyspnea. Deconditioned  Neurology: Awake and alert, non focal  E ENT: mild pallor, no icterus, oral mucosa moist Cardiovascular: No JVD. S1-S2 present, irregularly irregular with no gallops, rubs, or murmurs. No lower extremity edema. Pulmonary: positive breath sounds bilaterally, adequate air movement, no wheezing, rhonchi or rales. Gastrointestinal. Abdomen soft and non tender Skin. No rashes Musculoskeletal: shortening of left lower extremity.      Data Reviewed: I have personally reviewed following labs and imaging studies  CBC: Recent Labs  Lab 01/24/20 1836  WBC 9.4  HGB 12.8  HCT 38.5  MCV 101.6*  PLT 217   Basic Metabolic Panel: Recent Labs  Lab 01/24/20 1950  NA 137  K 4.5  CL 111  CO2 18*  GLUCOSE 97  BUN 31*  CREATININE 0.79  CALCIUM 6.5*   GFR: CrCl cannot be calculated (Unknown ideal weight.). Liver Function Tests: Recent Labs  Lab 01/24/20 1950  AST 35  ALT 8  ALKPHOS 24*  BILITOT 1.1  PROT 4.2*  ALBUMIN 2.1*   No results for input(s): LIPASE, AMYLASE in the last 168 hours. No results for input(s): AMMONIA in the last 168 hours. Coagulation Profile: Recent Labs  Lab 01/24/20 1836  INR 1.1   Cardiac Enzymes: No results for input(s): CKTOTAL, CKMB, CKMBINDEX,  TROPONINI in the last 168 hours. BNP (last 3 results) No results for input(s): PROBNP in the last 8760 hours. HbA1C: No results for input(s): HGBA1C in the last 72 hours. CBG: No results for input(s): GLUCAP in the last 168 hours. Lipid Profile: No results for input(s): CHOL, HDL, LDLCALC, TRIG, CHOLHDL, LDLDIRECT in the last 72 hours. Thyroid Function Tests: No results for input(s): TSH, T4TOTAL, FREET4, T3FREE, THYROIDAB in the last 72 hours. Anemia Panel: No results for input(s): VITAMINB12, FOLATE, FERRITIN, TIBC, IRON, RETICCTPCT in the last 72 hours.    Radiology Studies: I have reviewed all of the imaging during this hospital visit personally     Scheduled Meds: . losartan  25 mg Oral Daily  . metoprolol succinate  50 mg Oral Daily   Continuous Infusions: . cefTRIAXone (ROCEPHIN)  IV Stopped (01/25/20 0151)  . diltiazem (CARDIZEM) infusion 7.5 mg/hr (01/25/20 0827)     LOS: 1 day        Renaye Janicki Annett Gula, MD

## 2020-01-25 NOTE — Progress Notes (Addendum)
Subjective:     Patient reports pain as moderate to severe to L hip.  Patient resting in bed this am.  Daughter by bedside.  Noted that her mother is hard of hearing and has dementia.  States that her mother had an issue with her HR last night.  Objective:   VITALS:  Temp:  [97.4 F (36.3 C)] 97.4 F (36.3 C) (09/02 1816) Pulse Rate:  [25-163] 66 (09/03 0657) Resp:  [16-29] 22 (09/03 0657) BP: (130-179)/(72-109) 143/85 (09/03 0657) SpO2:  [62 %-98 %] 94 % (09/03 0657)  General: WDWN patient in NAD. Psych:  Appropriate mood and affect. Neuro:  A&O x 3, Moving all extremities, sensation intact to light touch HEENT:  EOMs intact Chest:  Even non-labored respirations Skin:C/D/I, no rashes or lesions Extremities: warm/dry, mild edema to L hip, no erythema or echymosis.  No lymphadenopathy. Pulses: Popliteus 2+ MSK:  ROM: TKE, MMT: able to perform quad set, (-) Homan's    LABS Recent Labs    01/24/20 1836  HGB 12.8  WBC 9.4  PLT 217   Recent Labs    01/24/20 1950  NA 137  K 4.5  CL 111  CO2 18*  BUN 31*  CREATININE 0.79  GLUCOSE 97   Recent Labs    01/24/20 1836  INR 1.1     Assessment/Plan:     Left hip intertrochanteric fx:  Plan to OR for definitive treatment Saturday 01/26/20. NWB L LE Hold blood thinners NPO after midnight. Daughter aware of plan of care. Acute onset A-fib management per Medicine team.  Alfredo Martinez PA-C [facility] Office:  [phone]  OR tomorrow with Dr. Aundria Rud for IM nailing.

## 2020-01-26 ENCOUNTER — Encounter (HOSPITAL_COMMUNITY)
Admission: EM | Disposition: A | Discharge status: Skilled Nursing Facility | Payer: Self-pay | Source: Home / Self Care | Attending: Internal Medicine

## 2020-01-26 ENCOUNTER — Inpatient Hospital Stay (HOSPITAL_COMMUNITY): Payer: Medicare Other | Admitting: Certified Registered"

## 2020-01-26 ENCOUNTER — Encounter (HOSPITAL_COMMUNITY): Payer: Self-pay | Admitting: Internal Medicine

## 2020-01-26 ENCOUNTER — Inpatient Hospital Stay (HOSPITAL_COMMUNITY): Payer: Medicare Other

## 2020-01-26 HISTORY — PX: INTRAMEDULLARY (IM) NAIL INTERTROCHANTERIC: SHX5875

## 2020-01-26 LAB — CBC WITH DIFFERENTIAL/PLATELET
Abs Immature Granulocytes: 0.09 10*3/uL — ABNORMAL HIGH (ref 0.00–0.07)
Basophils Absolute: 0 10*3/uL (ref 0.0–0.1)
Basophils Relative: 0 %
Eosinophils Absolute: 0 10*3/uL (ref 0.0–0.5)
Eosinophils Relative: 0 %
HCT: 35.3 % — ABNORMAL LOW (ref 36.0–46.0)
Hemoglobin: 11.7 g/dL — ABNORMAL LOW (ref 12.0–15.0)
Immature Granulocytes: 1 %
Lymphocytes Relative: 8 %
Lymphs Abs: 1.4 10*3/uL (ref 0.7–4.0)
MCH: 32.9 pg (ref 26.0–34.0)
MCHC: 33.1 g/dL (ref 30.0–36.0)
MCV: 99.2 fL (ref 80.0–100.0)
Monocytes Absolute: 1 10*3/uL (ref 0.1–1.0)
Monocytes Relative: 6 %
Neutro Abs: 14.4 10*3/uL — ABNORMAL HIGH (ref 1.7–7.7)
Neutrophils Relative %: 85 %
Platelets: 186 10*3/uL (ref 150–400)
RBC: 3.56 MIL/uL — ABNORMAL LOW (ref 3.87–5.11)
RDW: 12.2 % (ref 11.5–15.5)
WBC: 16.9 10*3/uL — ABNORMAL HIGH (ref 4.0–10.5)
nRBC: 0 % (ref 0.0–0.2)

## 2020-01-26 LAB — URINE CULTURE: Culture: >=100000 — AB

## 2020-01-26 LAB — SURGICAL PCR SCREEN
MRSA, PCR: NEGATIVE
Staphylococcus aureus: NEGATIVE

## 2020-01-26 SURGERY — FIXATION, FRACTURE, INTERTROCHANTERIC, WITH INTRAMEDULLARY ROD
Anesthesia: General | Site: Leg Upper | Laterality: Left

## 2020-01-26 MED ORDER — MENTHOL 3 MG MT LOZG: 1.0000 | LOZENGE | OROMUCOSAL | Status: DC | PRN

## 2020-01-26 MED ORDER — LACTATED RINGERS IV SOLN: INTRAVENOUS | Status: DC

## 2020-01-26 MED ORDER — METOPROLOL TARTRATE 25 MG PO TABS
25.0000 mg | ORAL_TABLET | Freq: Three times a day (TID) | ORAL | Status: DC
  Administered 2020-01-26 – 2020-01-28 (×5): 25 mg via ORAL
  Filled 2020-01-26 (×6): qty 1

## 2020-01-26 MED ORDER — CHLORHEXIDINE GLUCONATE 0.12 % MT SOLN
OROMUCOSAL | Status: AC
  Administered 2020-01-26: 15 mL via OROMUCOSAL
  Filled 2020-01-26: qty 15

## 2020-01-26 MED ORDER — FENTANYL CITRATE (PF) 250 MCG/5ML IJ SOLN
INTRAMUSCULAR | Status: AC
  Filled 2020-01-26: qty 5

## 2020-01-26 MED ORDER — METOCLOPRAMIDE HCL 5 MG/ML IJ SOLN: 5.0000 mg | Freq: Three times a day (TID) | INTRAMUSCULAR | Status: DC | PRN

## 2020-01-26 MED ORDER — ONDANSETRON HCL 4 MG/2ML IJ SOLN: 4.0000 mg | Freq: Four times a day (QID) | INTRAMUSCULAR | Status: DC | PRN

## 2020-01-26 MED ORDER — FENTANYL CITRATE (PF) 100 MCG/2ML IJ SOLN
INTRAMUSCULAR | Status: AC
  Administered 2020-01-26: 50 ug via INTRAVENOUS
  Filled 2020-01-26: qty 2

## 2020-01-26 MED ORDER — 0.9 % SODIUM CHLORIDE (POUR BTL) OPTIME
TOPICAL | Status: DC | PRN
  Administered 2020-01-26: 1000 mL

## 2020-01-26 MED ORDER — APIXABAN 2.5 MG PO TABS: 2.5000 mg | ORAL_TABLET | Freq: Two times a day (BID) | ORAL | Status: DC

## 2020-01-26 MED ORDER — ONDANSETRON HCL 4 MG/2ML IJ SOLN
INTRAMUSCULAR | Status: DC | PRN
  Administered 2020-01-26: 4 mg via INTRAVENOUS

## 2020-01-26 MED ORDER — PROPOFOL 10 MG/ML IV BOLUS
INTRAVENOUS | Status: DC | PRN
  Administered 2020-01-26: 60 mg via INTRAVENOUS

## 2020-01-26 MED ORDER — PROPOFOL 500 MG/50ML IV EMUL
INTRAVENOUS | Status: DC | PRN
  Administered 2020-01-26: 100 ug/kg/min via INTRAVENOUS

## 2020-01-26 MED ORDER — PHENYLEPHRINE 40 MCG/ML (10ML) SYRINGE FOR IV PUSH (FOR BLOOD PRESSURE SUPPORT)
PREFILLED_SYRINGE | INTRAVENOUS | Status: DC | PRN
  Administered 2020-01-26: 80 ug via INTRAVENOUS

## 2020-01-26 MED ORDER — CHLORHEXIDINE GLUCONATE 0.12 % MT SOLN: 15.0000 mL | Freq: Once | OROMUCOSAL | Status: AC

## 2020-01-26 MED ORDER — ONDANSETRON HCL 4 MG PO TABS
4.0000 mg | ORAL_TABLET | Freq: Four times a day (QID) | ORAL | Status: DC | PRN
  Administered 2020-01-30: 4 mg via ORAL
  Filled 2020-01-26: qty 1

## 2020-01-26 MED ORDER — FENTANYL CITRATE (PF) 100 MCG/2ML IJ SOLN
25.0000 ug | INTRAMUSCULAR | Status: DC | PRN
  Administered 2020-01-26: 50 ug via INTRAVENOUS

## 2020-01-26 MED ORDER — DOCUSATE SODIUM 100 MG PO CAPS
100.0000 mg | ORAL_CAPSULE | Freq: Two times a day (BID) | ORAL | Status: DC
  Administered 2020-01-26 – 2020-01-30 (×8): 100 mg via ORAL
  Filled 2020-01-26 (×8): qty 1

## 2020-01-26 MED ORDER — SODIUM CHLORIDE 0.9 % IV SOLN: INTRAVENOUS | Status: DC

## 2020-01-26 MED ORDER — ROCURONIUM BROMIDE 10 MG/ML (PF) SYRINGE
PREFILLED_SYRINGE | INTRAVENOUS | Status: DC | PRN
  Administered 2020-01-26: 50 mg via INTRAVENOUS

## 2020-01-26 MED ORDER — METOCLOPRAMIDE HCL 5 MG PO TABS: 5.0000 mg | ORAL_TABLET | Freq: Three times a day (TID) | ORAL | Status: DC | PRN

## 2020-01-26 MED ORDER — LABETALOL HCL 5 MG/ML IV SOLN
INTRAVENOUS | Status: AC
  Filled 2020-01-26: qty 4

## 2020-01-26 MED ORDER — FENTANYL CITRATE (PF) 100 MCG/2ML IJ SOLN
INTRAMUSCULAR | Status: DC | PRN
Start: 2020-01-26 — End: 2020-01-26
  Administered 2020-01-26: 75 ug via INTRAVENOUS

## 2020-01-26 MED ORDER — PROPOFOL 10 MG/ML IV BOLUS
INTRAVENOUS | Status: AC
  Filled 2020-01-26: qty 20

## 2020-01-26 MED ORDER — CEFAZOLIN SODIUM-DEXTROSE 2-4 GM/100ML-% IV SOLN: 2.0000 g | INTRAVENOUS | Status: DC

## 2020-01-26 MED ORDER — LIDOCAINE 2% (20 MG/ML) 5 ML SYRINGE
INTRAMUSCULAR | Status: DC | PRN
  Administered 2020-01-26: 40 mg via INTRAVENOUS

## 2020-01-26 MED ORDER — SUGAMMADEX SODIUM 200 MG/2ML IV SOLN
INTRAVENOUS | Status: DC | PRN
  Administered 2020-01-26: 120 mg via INTRAVENOUS

## 2020-01-26 MED ORDER — METOPROLOL TARTRATE 50 MG PO TABS
50.0000 mg | ORAL_TABLET | Freq: Three times a day (TID) | ORAL | Status: DC
  Administered 2020-01-26 (×2): 50 mg via ORAL
  Filled 2020-01-26 (×2): qty 1

## 2020-01-26 MED ORDER — PHENOL 1.4 % MT LIQD: 1.0000 | OROMUCOSAL | Status: DC | PRN

## 2020-01-26 MED ORDER — PHENYLEPHRINE HCL-NACL 10-0.9 MG/250ML-% IV SOLN
INTRAVENOUS | Status: DC | PRN
  Administered 2020-01-26: 40 ug/min via INTRAVENOUS

## 2020-01-26 MED ORDER — DEXAMETHASONE SODIUM PHOSPHATE 10 MG/ML IJ SOLN
INTRAMUSCULAR | Status: DC | PRN
  Administered 2020-01-26: 5 mg via INTRAVENOUS

## 2020-01-26 SURGICAL SUPPLY — 33 items
ALCOHOL 70% 16 OZ (MISCELLANEOUS) ×3 IMPLANT
BIT DRILL CALIBRATED 4.2 (BIT) IMPLANT
BNDG COHESIVE 6X5 TAN STRL LF (GAUZE/BANDAGES/DRESSINGS) ×4 IMPLANT
COVER PERINEAL POST (MISCELLANEOUS) ×3 IMPLANT
COVER SURGICAL LIGHT HANDLE (MISCELLANEOUS) ×3 IMPLANT
DRAPE INCISE IOBAN 66X45 STRL (DRAPES) ×3 IMPLANT
DRAPE STERI IOBAN 125X83 (DRAPES) ×3 IMPLANT
DRILL BIT CALIBRATED 4.2 (BIT) ×3
DRSG ADAPTIC 3X8 NADH LF (GAUZE/BANDAGES/DRESSINGS) ×3 IMPLANT
DURAPREP 26ML APPLICATOR (WOUND CARE) ×3 IMPLANT
GAUZE SPONGE 4X4 12PLY STRL (GAUZE/BANDAGES/DRESSINGS) ×2 IMPLANT
GAUZE SPONGE 4X4 12PLY STRL LF (GAUZE/BANDAGES/DRESSINGS) ×3 IMPLANT
GLOVE BIO SURGEON STRL SZ7.5 (GLOVE) ×5 IMPLANT
GLOVE BIOGEL PI IND STRL 8 (GLOVE) ×1 IMPLANT
GLOVE BIOGEL PI INDICATOR 8 (GLOVE) ×2
GOWN STRL REUS W/ TWL LRG LVL3 (GOWN DISPOSABLE) ×1 IMPLANT
GOWN STRL REUS W/ TWL XL LVL3 (GOWN DISPOSABLE) ×1 IMPLANT
GOWN STRL REUS W/TWL LRG LVL3 (GOWN DISPOSABLE) ×3
GOWN STRL REUS W/TWL XL LVL3 (GOWN DISPOSABLE) ×3
GUIDEWIRE 3.2X400 (WIRE) ×2 IMPLANT
KIT BASIN OR (CUSTOM PROCEDURE TRAY) ×3 IMPLANT
KIT TURNOVER KIT B (KITS) ×3 IMPLANT
NAIL CANN TFNA 9MM/130 DEG TI (Nail) ×2 IMPLANT
NS IRRIG 1000ML POUR BTL (IV SOLUTION) ×3 IMPLANT
PACK GENERAL/GYN (CUSTOM PROCEDURE TRAY) ×3 IMPLANT
PAD ARMBOARD 7.5X6 YLW CONV (MISCELLANEOUS) ×6 IMPLANT
SCREW FENES TFNA 95 (Screw) ×2 IMPLANT
SCREW LOCK STAR 5X36 (Screw) ×2 IMPLANT
STAPLER VISISTAT 35W (STAPLE) ×3 IMPLANT
SUT MON AB 2-0 CT1 36 (SUTURE) ×5 IMPLANT
TOWEL GREEN STERILE (TOWEL DISPOSABLE) ×3 IMPLANT
TOWEL GREEN STERILE FF (TOWEL DISPOSABLE) ×3 IMPLANT
WATER STERILE IRR 1000ML POUR (IV SOLUTION) ×3 IMPLANT

## 2020-01-26 NOTE — Anesthesia Postprocedure Evaluation (Signed)
Anesthesia Post Note  Patient: [name]  Procedure(s) Performed: INTRAMEDULLARY (IM) NAIL INTERTROCHANTRIC (Left Leg Upper)     Patient location during evaluation: PACU Anesthesia Type: General Level of consciousness: awake and alert Pain management: pain level controlled Vital Signs Assessment: post-procedure vital signs reviewed and stable Respiratory status: spontaneous breathing, nonlabored ventilation, respiratory function stable and patient connected to nasal cannula oxygen Cardiovascular status: blood pressure returned to baseline and stable Postop Assessment: no apparent nausea or vomiting Anesthetic complications: no   No complications documented.  Last Vitals:  Vitals:   01/26/20 1500 01/26/20 1538  BP: 140/80 (!) 128/92  Pulse: 71 71  Resp: 15 16  Temp: 36.9 C 36.9 C  SpO2: 95% 95%    Last Pain:  Vitals:   01/26/20 1445  TempSrc:   PainSc: Asleep                 Shelton Silvas

## 2020-01-26 NOTE — Plan of Care (Signed)

## 2020-01-26 NOTE — Anesthesia Preprocedure Evaluation (Addendum)
Anesthesia Evaluation  Patient identified by MRN, date of birth, ID band Patient confused    Reviewed: Allergy & Precautions, NPO status , Patient's Chart, lab work & pertinent test results  Airway Mallampati: I  TM Distance: >3 FB Neck ROM: Full    Dental  (+) Edentulous Upper, Edentulous Lower   Pulmonary neg pulmonary ROS,    breath sounds clear to auscultation       Cardiovascular hypertension, + Peripheral Vascular Disease  + dysrhythmias Atrial Fibrillation  Rhythm:Regular Rate:Normal     Neuro/Psych PSYCHIATRIC DISORDERS Dementia negative neurological ROS     GI/Hepatic Neg liver ROS, GERD  ,  Endo/Other  negative endocrine ROS  Renal/GU Renal disease     Musculoskeletal  (+) Arthritis ,   Abdominal Normal abdominal exam  (+)   Peds  Hematology negative hematology ROS (+)   Anesthesia Other Findings   Reproductive/Obstetrics                            Anesthesia Physical Anesthesia Plan  ASA: III  Anesthesia Plan: General   Post-op Pain Management:    Induction: Intravenous  PONV Risk Score and Plan: 4 or greater and Ondansetron, Treatment may vary due to age or medical condition and TIVA  Airway Management Planned: Oral ETT  Additional Equipment: None  Intra-op Plan:   Post-operative Plan: Extubation in OR  Informed Consent: I have reviewed the patients History and Physical, chart, labs and discussed the procedure including the risks, benefits and alternatives for the proposed anesthesia with the patient or authorized representative who has indicated his/her understanding and acceptance.     Consent reviewed with POA  Plan Discussed with: CRNA  Anesthesia Plan Comments: (Lab Results      Component                Value               Date                      WBC                      16.9 (H)            01/26/2020                HGB                      11.7 (L)             01/26/2020                HCT                      35.3 (L)            01/26/2020                MCV                      99.2                01/26/2020                PLT                      186  01/26/2020            Lab Results      Component                Value               Date                      CREATININE               1.01 (H)            01/26/2020                BUN                      30 (H)              01/26/2020                NA                       137                 01/26/2020                K                        4.1                 01/26/2020                CL                       101                 01/26/2020                CO2                      26                  01/26/2020           )       Anesthesia Quick Evaluation

## 2020-01-26 NOTE — Brief Op Note (Signed)
01/24/2020 - 01/26/2020  2:31 PM  PATIENT:  [name]  86 y.o. female  PRE-OPERATIVE DIAGNOSIS:  Left intertrochanteric femur fracture.  POST-OPERATIVE DIAGNOSIS: same  PROCEDURE:  Procedure(s): INTRAMEDULLARY (IM) NAIL INTERTROCHANTRIC (Left)  SURGEON:  Surgeon(s) and Role:    * Yolonda Kida, MD - Primary  PHYSICIAN ASSISTANT:  Dion Saucier, PA-C.  ANESTHESIA:   general  EBL:  50 mL   BLOOD ADMINISTERED:none  DRAINS: none   LOCAL MEDICATIONS USED:  NONE  SPECIMEN:  No Specimen  DISPOSITION OF SPECIMEN:  N/A  COUNTS:  YES  TOURNIQUET:  * No tourniquets in log *  DICTATION: .Note written in EPIC  PLAN OF CARE: Admit to inpatient   PATIENT DISPOSITION:  PACU - hemodynamically stable.   Delay start of Pharmacological VTE agent (>24hrs) due to surgical blood loss or risk of bleeding: not applicable

## 2020-01-26 NOTE — H&P (Signed)
H&P update  The surgical history has been reviewed and remains accurate without interval change.  The patient was re-examined and patient's physiologic condition has not changed significantly in the last 30 days. The condition still exists that makes this procedure necessary. The treatment plan remains the same, without new options for care.  No new pharmacological allergies or types of therapy has been initiated that would change the plan or the appropriateness of the plan.  The patient and/or family understand the potential benefits and risks.  Jowana Thumma P. Aundria Rud, MD 01/26/2020 1:19 PM

## 2020-01-26 NOTE — Progress Notes (Signed)
Initial Nutrition Assessment  DOCUMENTATION CODES:   Not applicable  INTERVENTION:  Monitor for diet advancement and provide oral nutrition supplement as appropriate  Recommend obtaining current weight as able to fully assess needs and recent trends   NUTRITION DIAGNOSIS:   Increased nutrient needs related to post-op healing, hip fracture as evidenced by estimated needs.    GOAL:   Patient will meet greater than or equal to 90% of their needs   MONITOR:   Labs, Skin, Diet advancement, PO intake, Weight trends, Supplement acceptance, I & O's  REASON FOR ASSESSMENT:   Consult Assessment of nutrition requirement/status, Hip fracture protocol  ASSESSMENT:  RD working remotely.  86 year old female with history significant of vascular dementia, GERD, HLD, HTN, PVD, CAD, Afib on eliquis, pulmonary fibrosis, and CKD stage 3, and 2 week history of urinary incontinence who presented to PCP office on 9/2 and diagnosed with UTI. On the way to pick up antibiotics, pt sustained mechanical fall while walking. Patient presented with severe left hip pain and LLE deformity, admitted with acute intertrochanteric left femur fracture.  Patient noted hard of hearing and has dementia. Unable to obtain nutrition history at this time secondary to RD working remotely. Patient is currently NPO, plans for IM nailing today. Will continue to monitor for diet advancement s/p procedure and will order oral nutrition supplements as appropriate.   No new weight on admit, recommend obtaining current wt as able to fully assess needs and current trends. Per chart, weights stable 56.9 kg - 58.3 kg (May-July 2021)  Medications reviewed and include: Tylenol, Bactrim IVPB: Ancef  Labs: BUN 30 (H), Cr 1.01 (H), WBC 16.9 (H)   NUTRITION - FOCUSED PHYSICAL EXAM: Unable to complete at this time, RD working remotely.  Diet Order:   Diet Order            Diet NPO time specified  Diet effective midnight            Diet NPO time specified Except for: Sips with Meds  Diet effective midnight                 EDUCATION NEEDS:   No education needs have been identified at this time  Skin:  Skin Assessment: Skin Integrity Issues: Skin Integrity Issues:: Other (Comment) Other: ecchymosis;L jaw  Last BM:  9/2  Height:   Ht Readings from Last 1 Encounters:  11/22/19 4\' 11"  (1.499 m)    Weight:   Wt Readings from Last 1 Encounters:  11/22/19 58.3 kg    Ideal Body Weight:  43.7 kg  BMI:  There is no height or weight on file to calculate BMI.  Estimated Nutritional Needs:   Kcal:  1500-1700  Protein:  75-85  Fluid:  >/= 1.4 L/day   01/23/20, RD, LDN Clinical Nutrition After Hours/Weekend Pager # in Amion

## 2020-01-26 NOTE — Progress Notes (Signed)
Received pt back from PACU s/p left IM Niailing hip fracture, dressing CDI, Pt in no apparent distress.

## 2020-01-26 NOTE — Transfer of Care (Signed)
Immediate Anesthesia Transfer of Care Note  Patient: [name]  Procedure(s) Performed: INTRAMEDULLARY (IM) NAIL INTERTROCHANTRIC (Left Leg Upper)  Patient Location: PACU  Anesthesia Type:General  Level of Consciousness: drowsy  Airway & Oxygen Therapy: Patient Spontanous Breathing and Patient connected to nasal cannula oxygen  Post-op Assessment: Report given to RN and Post -op Vital signs reviewed and stable  Post vital signs: Reviewed and stable  Last Vitals:  Vitals Value Taken Time  BP 111/75 01/26/20 1448  Temp    Pulse 110 01/26/20 1449  Resp 17 01/26/20 1449  SpO2 99 % 01/26/20 1449  Vitals shown include unvalidated device data.  Last Pain:  Vitals:   01/26/20 1445  TempSrc:   PainSc: (P) Asleep      Patients Stated Pain Goal: 2 (01/25/20 2242)  Complications: No complications documented.

## 2020-01-26 NOTE — Progress Notes (Signed)
ANTICOAGULATION CONSULT NOTE - Follow Up Consult  Pharmacy Consult for apixaban Indication: atrial fibrillation, left femur frature  No Known Allergies  Patient Measurements:   Vital Signs: Temp: 98.4 F (36.9 C) (09/04 1500) Temp Source: Oral (09/04 0738) BP: 140/80 (09/04 1500) Pulse Rate: 71 (09/04 1500)  Labs: Recent Labs    01/24/20 1836 01/24/20 1950 01/26/20 0903  HGB 12.8  --  11.7*  HCT 38.5  --  35.3*  PLT 217  --  186  LABPROT 13.6  --   --   INR 1.1  --   --   CREATININE  --  0.79 1.01*    CrCl cannot be calculated (Unknown ideal weight.).   Medications:  Medications Prior to Admission  Medication Sig Dispense Refill Last Dose  . acetaminophen (TYLENOL) 650 MG CR tablet Take 650 mg by mouth in the morning and at bedtime.    01/24/2020 at am  . apixaban (ELIQUIS) 2.5 MG TABS tablet Take 1 tablet (2.5 mg total) by mouth 2 (two) times daily. 60 tablet 3 01/24/2020 at 0800  . Calcium Carb-Cholecalciferol (CALTRATE 600+D3 SOFT) 600-800 MG-UNIT CHEW Chew 1 tablet by mouth in the morning and at bedtime.   01/24/2020 at am  . feeding supplement (BOOST HIGH PROTEIN) LIQD Take 1 Container by mouth in the morning.   01/24/2020 at am  . losartan (COZAAR) 50 MG tablet Take 25 mg by mouth in the morning.    01/24/2020 at am  . metoprolol succinate (TOPROL-XL) 50 MG 24 hr tablet Take 50 mg by mouth in the morning.    01/24/2020 at 0800  . NON FORMULARY Take 1 tablet by mouth See admin instructions. Vitafusion Women's Gummy Vitamins- Chew 1 gummie by mouth 2 times a day- morning and night   01/24/2020 at am  . NON FORMULARY Pair of compression stockings 15-82mm Hg   N/A  . sulfamethoxazole-trimethoprim (BACTRIM DS) 800-160 MG tablet Take 1 tablet by mouth in the morning and at bedtime. FOR 5 DAYS   Not yet at Not yet    Assessment: 86 yo female s/p fall with left hip fracture on apixaban PTA for afib. Plans are to restart 9/5 if hg stable post surgery -Hg= 11.6, SCr= 1.0 -wt= 57.2  (01/24/20 in clinic)  PTA apixiban dose: 2.5mg  po bid   Goal of Therapy:  Monitor platelets by anticoagulation protocol: Yes   Plan:   -restart apixaban 2.5mg  po bid on 9/5 if Hg stable  Harland German, PharmD Clinical Pharmacist **Pharmacist phone directory can now be found on [email or web address] ([email or web address]).  Listed under [facility].

## 2020-01-26 NOTE — Social Work (Signed)
CSW acknowledging consult for SNF placement. Will follow for therapy recommendations needed to best determine disposition/for insurance authorization.   Andray Assefa, MSW, LCSW [facility] Clinical Social Work    

## 2020-01-26 NOTE — Op Note (Signed)
Date of Surgery: 01/26/2020  INDICATIONS: [name] is a 86 y.o.-year-old female who sustained a left hip fracture. The risks and benefits of the procedure discussed with the patient and family prior to the procedure and all questions were answered; consent was obtained.  PREOPERATIVE DIAGNOSIS: left hip fracture   POSTOPERATIVE DIAGNOSIS: Same   PROCEDURE: Treatment of intertrochanteric, pertrochanteric, subtrochanteric fracture with intramedullary implant. CPT [phone]   SURGEON: Kathi Der. Aundria Rud, M.D.   Physician Assistant: Dion Saucier, PA-C.  Assistant attestation: PA Mcclung was necessary throughout all vital portions of the case.  He assisted with transporting the patient from the stretcher onto the operative table, positioning the patient, obtaining the appropriate reduction and implantation of deep implants.  He was also utilized for closure and transported back to PACU.  ANESTHESIA: general   IV FLUIDS AND URINE: See anesthesia record   ESTIMATED BLOOD LOSS: 50 cc  IMPLANTS:   Synthes TFN a 9 x 170 mm\ 95 mm proximal screw 36 x 5 mm distal interlock  DRAINS: None.   COMPLICATIONS: None.   DESCRIPTION OF PROCEDURE: The patient was brought to the operating room and placed supine on the operating table. The patient's leg had been signed prior to the procedure. The patient had the anesthesia placed by the anesthesiologist. The prep verification and incision time-outs were performed to confirm that this was the correct patient, site, side and location. The patient had an SCD on the opposite lower extremity. The patient did receive antibiotics prior to the incision and was re-dosed during the procedure as needed at indicated intervals. The patient was positioned on the fracture table with the table in traction and internal rotation to reduce the hip. The well leg was placed in a scissor position and all bony prominences were well-padded. The patient had the lower extremity  prepped and draped in the standard surgical fashion. The incision was made 4 finger breadths superior to the greater trochanter. A guide pin was inserted into the tip of the greater trochanter under fluoroscopic guidance. An opening reamer was used to gain access to the femoral canal. The nail length was measured and inserted down the femoral canal to its proper depth. The appropriate version of insertion for the lag screw was found under fluoroscopy. A pin was inserted up the femoral neck through the jig. The length of the lag screw was then measured. The lag screw was inserted as near to center-center in the head as possible. The leg was taken out of traction, then the compression screw was used to compress across the fracture. Compression was visualized on serial xrays.   We next turned our attention to the distal interlocking screw.  This was placed through the drill guide of the nail inserter.  A small incision was made overlying the lateral thigh at the screw site, and a tonsil was used to disect down to bone.  A drill pass was made through the jig and across the nail through both cortices.  This was measured, and the appropriate screw was placed under hand power and found to have good bite.    The wound was copiously irrigated with saline and the subcutaneous layer closed with 2.0 Monocryl  and the skin was reapproximated with staples. The wounds were cleaned and dried a final time and a sterile dressing was placed. The hip was taken through a range of motion at the end of the case under fluoroscopic imaging to visualize the approach-withdraw phenomenon and confirm implant length in  the head. The patient was then awakened from anesthesia and taken to the recovery room in stable condition. All counts were correct at the end of the case.   POSTOPERATIVE PLAN: The patient will be weight bearing as tolerated and will return in 2 weeks for staple removal and the patient will receive DVT prophylaxis based on  other medications, activity level, and risk ratio of bleeding to thrombosis.  She is appropriate to resume her preoperative Eliquis starting tomorrow morning.   [name] Rued, MD [facility] [phone] 2:35 PM

## 2020-01-26 NOTE — Progress Notes (Signed)
PROGRESS NOTE    [name]  MBW:[id number] DOB: [birth year] DOA: 01/24/2020 PCP: Delorse Lek, MD    Brief Narrative:  Patient admitted to the hospital with a working diagnosis of left intertrochanteric fracture. Complicated with atrial fibrillation with rapid ventricular response.   86 year old female past medical history for hypertension, chronic kidney disease stage IIIa, vascular dementia, atrial fibrillation, pulmonary fibrosis and history of carotid artery disease.  Patient reported urinary incontinence for about 2 weeks, as an outpatient she was diagnosed with urinary tract infection, she was placed on Bactrim.  She sustained a mechanical fall from her own height while walking.  Post trauma she had severe left hip pain, left lower extremity deformity, and she was unable to bear weight or ambulate.  On her initial physical examination blood pressure 140/84, heart rate 91, respiratory rate 18, temperature 97.4, oxygen saturation 94%.  Her lungs were clear to auscultation bilaterally, heart S1-S2, present rhythmic, soft abdomen, no lower extremity edema, she had left hip tenderness.  Sodium 137, potassium 4.5, chloride 111, bicarb 18, glucose 97, BUN 31, creatinine 0.79, white count 9.4, hemoglobin 12.8, hematocrit 38.5, platelets 217.  SARS COVID-19 negative.  Urinalysis 11-20 white cells, 6-10 red cells, specific gravity 1.015, positive nitrates.  Head CT no acute changes.  Pelvic x-ray with mildly displaced, comminuted and slightly valgus angulated intertrochanteric left femur fracture.  Chest radiograph with bilateral increased lung markings/fibrosis. EKG 97 bpm, left axis deviation, left anterior fascicular block, normal QTC, atrial fibrillation rhythm, no ST segment or T wave changes  Patient placed on analgesics for pain control and orthopedics was consulted. She developed atrial fibrillation with rapid ventricular response that required IV diltiazem infusion for rate control.  Metoprolol dose has been increased, holding on apixaban for orthopedic procedure.   Patient's mobility is impaired due to ambulatory dysfunction, at baseline with no angina. Patient has moderate cardiovascular risk for orthopedic procedure.    Assessment & Plan:   Principal Problem:   Closed left hip fracture, initial encounter [facility]) Active Problems:   Permanent atrial fibrillation (HCC)   Acute lower UTI   HTN (hypertension)   Pulmonary fibrosis (HCC)    1. Acute intertrochanteric left femur fracture. Reports improve pain control with scheduled acetaminophen, plus as needed hydrocodone and morphine  Follow with orthopedics for surgical intervention.   2. Chronic permanent atrial fibrillation/ NEW RVR.  Telemetry personally reviewed with improve rate control, 100 to 120 bpm, now off diltiazem. No clinical signs of acute heart failure.   Will increase metoprolol to 50 mg po tid, and continue telemetry monitoring. Continue to hold on anticoagulation in preparation for surgical intervention. Discontinue IV fluids, follow a restrictive IV fluids strategy.   3. Urinary tract infection. Plan to continue bactrim for a total of 3 days.   4. Chronic pulmonary fibrosis. Echocardiogram from 06/21 with increased RV systolic pressure and mild reduction in RV systolic function, consistent with pulmonary hypertension.   Continue close oxymetry monitoring, avoid volume overload.   5. HTN.  Blood pressure this am 135/98, continue to hold on losartan, continue metoprolol for a fib rate control.   6. CKD stage 3a. Base serum cr at 1,0. Close follow up on renal function and electrolytes. Will hold on IV fluids for now. Avoid hypotension and nephrotoxic medications.     Patient continue to be at high risk for worsening atrial fibrillation with RVR   Status is: Inpatient  Remains inpatient appropriate because:Inpatient level of care appropriate due to severity of  illness   Dispo: The  patient is from: Home              Anticipated d/c is to: SNF              Anticipated d/c date is: 3 days              Patient currently is not medically stable to d/c.   DVT prophylaxis: apixaban on hold   Code Status:    full  Family Communication:  No family at the bedside      Consultants:   Orthopedics     Subjective: Patient is feeling well, pain at the left hip is controlled, no dyspnea, chest pain or palpitations,. No nausea or vomiting.   Objective: Vitals:   01/25/20 1740 01/25/20 1900 01/26/20 0300 01/26/20 0738  BP: (!) 161/95 123/90 124/90 (!) 135/98  Pulse: 95 100 95 (!) 106  Resp: 18 16 17 17   Temp: 98.4 F (36.9 C) 98 F (36.7 C) 98.5 F (36.9 C) 97.6 F (36.4 C)  TempSrc: Oral Oral Oral Oral  SpO2:  93% 93% 93%    Intake/Output Summary (Last 24 hours) at 01/26/2020 0858 Last data filed at 01/26/2020 0500 Gross per 24 hour  Intake 72.91 ml  Output 500 ml  Net -427.09 ml   There were no vitals filed for this visit.  Examination:   General: Not in pain or dyspnea, deconditioned  Neurology: Awake and alert, non focal  E ENT: mild pallor, no icterus, oral mucosa moist Cardiovascular: No JVD. S1-S2 present, irregularly irregular, no gallops, rubs, or murmurs. No lower extremity edema. Pulmonary: positive breath sounds bilaterally, adequate air movement, no wheezing, rhonchi or rales. Anterior auscultation.  Gastrointestinal. Abdomen soft and non tender Skin. No rashes Musculoskeletal: left lower extremity shortened./      Data Reviewed: I have personally reviewed following labs and imaging studies  CBC: Recent Labs  Lab 01/24/20 1836  WBC 9.4  HGB 12.8  HCT 38.5  MCV 101.6*  PLT 217   Basic Metabolic Panel: Recent Labs  Lab 01/24/20 1950  NA 137  K 4.5  CL 111  CO2 18*  GLUCOSE 97  BUN 31*  CREATININE 0.79  CALCIUM 6.5*   GFR: CrCl cannot be calculated (Unknown ideal weight.). Liver Function Tests: Recent Labs  Lab  01/24/20 1950  AST 35  ALT 8  ALKPHOS 24*  BILITOT 1.1  PROT 4.2*  ALBUMIN 2.1*   No results for input(s): LIPASE, AMYLASE in the last 168 hours. No results for input(s): AMMONIA in the last 168 hours. Coagulation Profile: Recent Labs  Lab 01/24/20 1836  INR 1.1   Cardiac Enzymes: No results for input(s): CKTOTAL, CKMB, CKMBINDEX, TROPONINI in the last 168 hours. BNP (last 3 results) No results for input(s): PROBNP in the last 8760 hours. HbA1C: No results for input(s): HGBA1C in the last 72 hours. CBG: No results for input(s): GLUCAP in the last 168 hours. Lipid Profile: No results for input(s): CHOL, HDL, LDLCALC, TRIG, CHOLHDL, LDLDIRECT in the last 72 hours. Thyroid Function Tests: No results for input(s): TSH, T4TOTAL, FREET4, T3FREE, THYROIDAB in the last 72 hours. Anemia Panel: No results for input(s): VITAMINB12, FOLATE, FERRITIN, TIBC, IRON, RETICCTPCT in the last 72 hours.    Radiology Studies: I have reviewed all of the imaging during this hospital visit personally     Scheduled Meds:  acetaminophen  650 mg Oral Q6H   metoprolol tartrate  50 mg Oral TID  sulfamethoxazole-trimethoprim  1 tablet Oral Q12H   Continuous Infusions:  sodium chloride 75 mL/hr at 01/26/20 0252    ceFAZolin (ANCEF) IV       LOS: 2 days        Shemaiah Round Annett Gula, MD

## 2020-01-26 NOTE — Anesthesia Procedure Notes (Signed)
Procedure Name: Intubation Date/Time: 01/26/2020 1:43 PM Performed by: Elliot Dally, CRNA Pre-anesthesia Checklist: Patient identified, Emergency Drugs available, Suction available and Patient being monitored Patient Re-evaluated:Patient Re-evaluated prior to induction Oxygen Delivery Method: Circle System Utilized Preoxygenation: Pre-oxygenation with 100% oxygen Induction Type: IV induction Ventilation: Mask ventilation without difficulty Laryngoscope Size: Miller and 2 Grade View: Grade I Tube type: Oral Tube size: 7.0 mm Number of attempts: 1 Airway Equipment and Method: Stylet and Oral airway Placement Confirmation: ETT inserted through vocal cords under direct vision,  positive ETCO2 and breath sounds checked- equal and bilateral Secured at: 21 cm Tube secured with: Tape Dental Injury: Teeth and Oropharynx as per pre-operative assessment

## 2020-01-27 DIAGNOSIS — S72142A Displaced intertrochanteric fracture of left femur, initial encounter for closed fracture: Principal | ICD-10-CM

## 2020-01-27 DIAGNOSIS — N1831 Chronic kidney disease, stage 3a: Secondary | ICD-10-CM

## 2020-01-27 DIAGNOSIS — I4891 Unspecified atrial fibrillation: Secondary | ICD-10-CM

## 2020-01-27 DIAGNOSIS — T148XXA Other injury of unspecified body region, initial encounter: Secondary | ICD-10-CM

## 2020-01-27 LAB — BASIC METABOLIC PANEL
Anion gap: 10 (ref 5–15)
Anion gap: 7 (ref 5–15)
BUN: 30 mg/dL — ABNORMAL HIGH (ref 8–23)
BUN: 34 mg/dL — ABNORMAL HIGH (ref 8–23)
CO2: 26 mmol/L (ref 22–32)
CO2: 27 mmol/L (ref 22–32)
Calcium: 8.8 mg/dL — ABNORMAL LOW (ref 8.9–10.3)
Calcium: 8.6 mg/dL — ABNORMAL LOW (ref 8.9–10.3)
Chloride: 101 mmol/L (ref 98–111)
Chloride: 103 mmol/L (ref 98–111)
Creatinine, Ser: 1.01 mg/dL — ABNORMAL HIGH (ref 0.44–1.00)
Creatinine, Ser: 1.11 mg/dL — ABNORMAL HIGH (ref 0.44–1.00)
GFR calc Af Amer: 59 mL/min — ABNORMAL LOW (ref >60)
GFR calc Af Amer: 52 mL/min — ABNORMAL LOW (ref >60)
GFR calc non Af Amer: 51 mL/min — ABNORMAL LOW (ref >60)
GFR calc non Af Amer: 45 mL/min — ABNORMAL LOW (ref >60)
Glucose, Bld: 127 mg/dL — ABNORMAL HIGH (ref 70–99)
Glucose, Bld: 135 mg/dL — ABNORMAL HIGH (ref 70–99)
Potassium: 4.1 mmol/L (ref 3.5–5.1)
Potassium: 4.6 mmol/L (ref 3.5–5.1)
Sodium: 137 mmol/L (ref 135–145)
Sodium: 137 mmol/L (ref 135–145)

## 2020-01-27 LAB — CBC WITH DIFFERENTIAL/PLATELET
Abs Immature Granulocytes: 0.08 10*3/uL — ABNORMAL HIGH (ref 0.00–0.07)
Basophils Absolute: 0 10*3/uL (ref 0.0–0.1)
Basophils Relative: 0 %
Eosinophils Absolute: 0 10*3/uL (ref 0.0–0.5)
Eosinophils Relative: 0 %
HCT: 32.1 % — ABNORMAL LOW (ref 36.0–46.0)
Hemoglobin: 10.7 g/dL — ABNORMAL LOW (ref 12.0–15.0)
Immature Granulocytes: 1 %
Lymphocytes Relative: 6 %
Lymphs Abs: 0.9 10*3/uL (ref 0.7–4.0)
MCH: 33.2 pg (ref 26.0–34.0)
MCHC: 33.3 g/dL (ref 30.0–36.0)
MCV: 99.7 fL (ref 80.0–100.0)
Monocytes Absolute: 0.5 10*3/uL (ref 0.1–1.0)
Monocytes Relative: 4 %
Neutro Abs: 12.6 10*3/uL — ABNORMAL HIGH (ref 1.7–7.7)
Neutrophils Relative %: 89 %
Platelets: 174 10*3/uL (ref 150–400)
RBC: 3.22 MIL/uL — ABNORMAL LOW (ref 3.87–5.11)
RDW: 12.3 % (ref 11.5–15.5)
WBC: 14.1 10*3/uL — ABNORMAL HIGH (ref 4.0–10.5)
nRBC: 0 % (ref 0.0–0.2)

## 2020-01-27 MED ORDER — APIXABAN 2.5 MG PO TABS
2.5000 mg | ORAL_TABLET | Freq: Two times a day (BID) | ORAL | Status: DC
  Administered 2020-01-27 – 2020-02-01 (×11): 2.5 mg via ORAL
  Filled 2020-01-27 (×11): qty 1

## 2020-01-27 NOTE — Evaluation (Signed)
Physical Therapy Evaluation Patient Details Name: [name] MRN: [id number] DOB: 1933/02/23 Today's Date: 01/27/2020   History of Present Illness  [name] is a 86 y.o. female with medical history significant of CKD 3, HTN, vascular dementia, A.Fib on eliquis, bilateral CEAs. Pt with recent diagnosis of UTI. Pt s/p fall and sustained L hip fracture. Pt s/p ORIF on 9/4 - L LE WBAT.    Clinical Impression  Pt admitted with above. Dtr in law present and stated her confusion is baseline, some days are better than others. She is aware patient can not return home alone and reports "Ive tried to tell her sons this wasn't a good idea. But I guess it took this fall for them to realize she shouldn't be by herself." Pt with aware she fell but had no idea she broke her L hip or had surgery despite re-orientation, no carry over. Pt grimacing in pain in L LE during transfers but did tolerate PWB through L LE during std pvt transfer to chair. Will trial RW next session. Recommending SNF upon d/c as pt unsafe to live by herself as she requires assist for all mobility and ADLs.    Follow Up Recommendations SNF;Supervision/Assistance - 24 hour    Equipment Recommendations  None recommended by PT (TBD at next venue)    Recommendations for Other Services       Precautions / Restrictions Precautions Precautions: Fall Precaution Comments: pt very confused Restrictions Weight Bearing Restrictions: Yes LLE Weight Bearing: Weight bearing as tolerated      Mobility  Bed Mobility Overal bed mobility: Needs Assistance Bed Mobility: Supine to Sit     Supine to sit: Max assist;+2 for physical assistance     General bed mobility comments: pt moved R LE towards EOB but required assist with L LE, ultimately both LEs to bring hips to eob, maxA for trunk elevation, pt more afraid of the pain  Transfers Overall transfer level: Needs assistance Equipment used: 2 person hand held assist (2 person  lift from the front with gait belt ) Transfers: Sit to/from [employer] Sit to Stand: Mod assist;Max assist;+2 physical assistance Stand pivot transfers: Mod assist;Max assist;+2 physical assistance       General transfer comment: PT provided crutch like support under pt's L arm to off weight L LE. Pt did WB some through L LE and took 4 steps to chair  Ambulation/Gait             General Gait Details: unable at this time  Stairs            Wheelchair Mobility    Modified Rankin (Stroke Patients Only)       Balance Overall balance assessment: Needs assistance Sitting-balance support: Feet supported;Single extremity supported Sitting balance-Leahy Scale: Poor Sitting balance - Comments: pt with significant R lateral lean due to inability to withstand pressure/wbing on L hip, pt initially required maxA to maintain EOB balance and then transitioned to min guard Postural control: Posterior lean Standing balance support: Bilateral upper extremity supported;During functional activity Standing balance-Leahy Scale: Zero Standing balance comment: dependent on physical assist                             Pertinent Vitals/Pain Pain Assessment: Faces Faces Pain Scale: Hurts even more Pain Location: L hip and knee Pain Descriptors / Indicators: Grimacing;Guarding Pain Intervention(s): Monitored during session;Limited activity within patient's tolerance    Home  Living Family/patient expects to be discharged to:: Skilled nursing facility                 Additional Comments: pt was living home alone, family would check on her frequently. Pt was using a cane and doing all ADLs except cooking    Prior Function Level of Independence: Independent with assistive device(s)         Comments: pt used a cane, didn't drive or cook but did everything else herself     Hand Dominance   Dominant Hand: Right    Extremity/Trunk Assessment   Upper  Extremity Assessment Upper Extremity Assessment: Generalized weakness    Lower Extremity Assessment Lower Extremity Assessment: LLE deficits/detail LLE Deficits / Details: minimal active movement due to pain, unable to tolerate PROM to L hip but was able to do 1/2 range LAQ in sitting    Cervical / Trunk Assessment Cervical / Trunk Assessment: Kyphotic  Communication   Communication: HOH  Cognition Arousal/Alertness: Awake/alert Behavior During Therapy: WFL for tasks assessed/performed Overall Cognitive Status: History of cognitive impairments - at baseline                                 General Comments: per dtr in law "it comes and goes, sometimes she's with it, sometimes she's not"  Pt aware she fell but didn't know that she fractured her L LE or that she had surgery. Pt able to follow commands but with delayed processing      General Comments General comments (skin integrity, edema, etc.): VSS, pt on 2LO2 via [place]    Exercises     Assessment/Plan    PT Assessment Patient needs continued PT services  PT Problem List Decreased strength;Decreased range of motion;Decreased activity tolerance;Decreased balance;Decreased mobility;Decreased coordination;Decreased cognition;Decreased knowledge of use of DME;Decreased safety awareness       PT Treatment Interventions DME instruction;Gait training;Stair training;Functional mobility training;Therapeutic activities;Therapeutic exercise;Balance training    PT Goals (Current goals can be found in the Care Plan section)  Acute Rehab PT Goals PT Goal Formulation: With patient/family Time For Goal Achievement: 02/10/20 Potential to Achieve Goals: Good    Frequency Min 3X/week   Barriers to discharge Decreased caregiver support pt lives alone and doesn't have 24/7 assist    Co-evaluation               AM-PAC PT "6 Clicks" Mobility  Outcome Measure Help needed turning from your back to your side while in a flat  bed without using bedrails?: A Lot Help needed moving from lying on your back to sitting on the side of a flat bed without using bedrails?: A Lot Help needed moving to and from a bed to a chair (including a wheelchair)?: A Lot Help needed standing up from a chair using your arms (e.g., wheelchair or bedside chair)?: A Lot Help needed to walk in hospital room?: Total Help needed climbing 3-5 steps with a railing? : Total 6 Click Score: 10    End of Session Equipment Utilized During Treatment: Gait belt;Oxygen (2LO2 via [place]) Activity Tolerance: Patient tolerated treatment well;Patient limited by pain Patient left: in chair;with call bell/phone within reach;with chair alarm set;with family/visitor present Nurse Communication: Mobility status PT Visit Diagnosis: Unsteadiness on feet (R26.81)    Time: 5956-3875 PT Time Calculation (min) (ACUTE ONLY): 31 min   Charges:   PT Evaluation $PT Eval Moderate Complexity: 1 Mod PT Treatments $Therapeutic  Activity: 8-22 mins        Lewis Shock, PT, DPT Acute Rehabilitation Services Pager #: [phone] Office #: [phone]   Iona Hansen 01/27/2020, 10:45 AM

## 2020-01-27 NOTE — Plan of Care (Signed)
°  Problem: Education: °Goal: Knowledge of General Education information will improve °Description: Including pain rating scale, medication(s)/side effects and non-pharmacologic comfort measures °Outcome: Progressing °  °Problem: Health Behavior/Discharge Planning: °Goal: Ability to manage health-related needs will improve °Outcome: Progressing °  °Problem: Clinical Measurements: °Goal: Will remain free from infection °Outcome: Progressing °  °Problem: Activity: °Goal: Risk for activity intolerance will decrease °Outcome: Progressing °  °Problem: Nutrition: °Goal: Adequate nutrition will be maintained °Outcome: Progressing °  °Problem: Elimination: °Goal: Will not experience complications related to bowel motility °Outcome: Progressing °  °

## 2020-01-27 NOTE — Progress Notes (Addendum)
Patient ID: [name], female   DOB: 06-27-1932, 86 y.o.   MRN: [id number]  PROGRESS NOTE    [name]  ESP:[id number] DOB: 02-Jun-1932 DOA: 01/24/2020 PCP: Delorse Lek, MD    Brief Narrative:  86 year old female past medical history for hypertension, chronic kidney disease stage IIIa, vascular dementia, atrial fibrillation, pulmonary fibrosisand history of carotid artery disease. Patient reported urinary incontinence for about 2 weeks, as an outpatient she was diagnosed with urinary tract infection, she was placed on Bactrim. She sustained a mechanical fall from her own height while walking. Post trauma she had severe left hip pain, left lower extremity deformity, andshe was unable to bear weight or ambulate.   Pelvic x-ray with mildly displaced, comminuted and slightly valgus angulated intertrochanteric left femur fracture.  She developed atrial fibrillation with rapid ventricular response that required IV diltiazem infusion for rate control.   She underwent intramedullary implant for treatment on 01/26/2020.  Assessment & Plan:   Principal Problem:   Closed left hip fracture, initial encounter [facility]) Active Problems:   Permanent atrial fibrillation (HCC)   Acute lower UTI   HTN (hypertension)   Pulmonary fibrosis (HCC)  1. Acute intertrochanteric left femur fracture. Status post intramedullary implant on 01/26/2020. She has been up with physical therapy today. She denies significant hip pain.  2. Chronic permanent atrial fibrillation/ NEW RVR.   Increase metoprolol to 50 mg po tid Eliquis  3. Urinary tract infection. Plan to continue bactrim for a total of 3 days.   4. Chronic pulmonary fibrosis. No acute respiratory issues Continue to monitor  5. HTN.   BP creeping up to 154/82, will resume losartan..   6. CKD stage 3a. Base serum cr at 1.0. Today is 1.1 continue to trend avoid hypotension and nephrotoxic medications.   7. Acute blood loss  anemia Status post fracture with operative fix. We will continue to trend hemoglobin.   DVT prophylaxis: [id number] Code Status: Full code  Family Communication: Daughter at bedside Disposition Plan: SNF  Patient remains inpatient due to unsafe discharge plan. Will need SNF.  Consultants:   Orthopedics  Procedures:  Intramedullary implant, ORIF left hip  Antimicrobials: Anti-infectives (From admission, onward)   Start     Dose/Rate Route Frequency Ordered Stop   01/26/20 1200  ceFAZolin (ANCEF) IVPB 2g/100 mL premix        2 g 200 mL/hr over 30 Minutes Intravenous To [facility]Stay [facility] 01/25/20 2246 01/26/20 1349   01/26/20 0600  ceFAZolin (ANCEF) IVPB 2g/100 mL premix  Status:  Discontinued        2 g 200 mL/hr over 30 Minutes Intravenous On call to O.R. 01/26/20 0103 01/26/20 0110   01/25/20 1500  sulfamethoxazole-trimethoprim (BACTRIM DS) 800-160 MG per tablet 1 tablet        1 tablet Oral Every 12 hours 01/25/20 1433 01/28/20 0959   01/24/20 2130  cefTRIAXone (ROCEPHIN) 1 g in sodium chloride 0.9 % 100 mL IVPB  Status:  Discontinued        1 g 200 mL/hr over 30 Minutes Intravenous Every 24 hours 01/24/20 2127 01/25/20 1433       Subjective: Feels okay today. She is sitting up. Does not have much appetite.  Objective: Vitals:   01/26/20 2320 01/27/20 0404 01/27/20 0800 01/27/20 1454  BP: 125/82 (!) 154/82 127/66 (!) 97/58  Pulse: 90 (!) 103 96 94  Resp: 16 17 17 16   Temp: 98.3 F (36.8 C) 98.2 F (36.8 C) 98.1 F (36.7  C) 98 F (36.7 C)  TempSrc: Oral Oral Oral Oral  SpO2: 99% 98% 99% 95%    Intake/Output Summary (Last 24 hours) at 01/27/2020 1615 Last data filed at 01/27/2020 1500 Gross per 24 hour  Intake 480 ml  Output 950 ml  Net -470 ml   There were no vitals filed for this visit.  Examination:  General exam: Appears calm and comfortable, cachectic Respiratory system: Clear to auscultation. Respiratory effort normal. Cardiovascular system: S1 &  S2 heard, RRR.  Gastrointestinal system: Abdomen is nondistended, soft and nontender.  Central nervous system: Alert and oriented. No focal neurological deficits. Extremities: Symmetric  Skin: No rashes Psychiatry: Judgement and insight appear normal. Mood & affect appropriate.     Data Reviewed: I have personally reviewed following labs and imaging studies  CBC: Recent Labs  Lab 01/24/20 1836 01/26/20 0903 01/27/20 0313  WBC 9.4 16.9* 14.1*  NEUTROABS  --  14.4* 12.6*  HGB 12.8 11.7* 10.7*  HCT 38.5 35.3* 32.1*  MCV 101.6* 99.2 99.7  PLT 217 186 174   Basic Metabolic Panel: Recent Labs  Lab 01/24/20 1950 01/26/20 0903 01/27/20 0313  NA 137 137 137  K 4.5 4.1 4.6  CL 111 101 103  CO2 18* 26 27  GLUCOSE 97 127* 135*  BUN 31* 30* 34*  CREATININE 0.79 1.01* 1.11*  CALCIUM 6.5* 8.8* 8.6*   GFR: CrCl cannot be calculated (Unknown ideal weight.). Liver Function Tests: Recent Labs  Lab 01/24/20 1950  AST 35  ALT 8  ALKPHOS 24*  BILITOT 1.1  PROT 4.2*  ALBUMIN 2.1*   Coagulation Profile: Recent Labs  Lab 01/24/20 1836  INR 1.1     Recent Results (from the past 240 hour(s))  SARS Coronavirus 2 by RT PCR (hospital order, performed in [facility] hospital lab) Nasopharyngeal Nasopharyngeal Swab     Status: None   Collection Time: 01/24/20  6:55 PM   Specimen: Nasopharyngeal Swab  Result Value Ref Range Status   SARS Coronavirus 2 NEGATIVE NEGATIVE Final    Comment: (NOTE) SARS-CoV-2 target nucleic acids are NOT DETECTED.  The SARS-CoV-2 RNA is generally detectable in upper and lower respiratory specimens during the acute phase of infection. The lowest concentration of SARS-CoV-2 viral copies this assay can detect is 250 copies / mL. A negative result does not preclude SARS-CoV-2 infection and should not be used as the sole basis for treatment or other patient management decisions.  A negative result may occur with improper specimen collection /  handling, submission of specimen other than nasopharyngeal swab, presence of viral mutation(s) within the areas targeted by this assay, and inadequate number of viral copies (<250 copies / mL). A negative result must be combined with clinical observations, patient history, and epidemiological information.  Fact Sheet for Patients:   BoilerBrush.com.cy  Fact Sheet for Healthcare Providers: https://pope.com/  This test is not yet approved or  cleared by the [place] FDA and has been authorized for detection and/or diagnosis of SARS-CoV-2 by FDA under an Emergency Use Authorization (EUA).  This EUA will remain in effect (meaning this test can be used) for the duration of the COVID-19 declaration under Section 564(b)(1) of the Act, 21 U.S.C. section 360bbb-3(b)(1), unless the authorization is terminated or revoked sooner.  Performed at [facility], [address], [place], [place] [address]   Culture, Urine     Status: Abnormal   Collection Time: 01/24/20 10:42 PM   Specimen: Urine, Random  Result Value Ref  Range Status   Specimen Description URINE, RANDOM  Final   Special Requests   Final    NONE Performed at [facility], [address] [place], [place] [address]    Culture >=100,000 COLONIES/mL ESCHERICHIA COLI (A)  Final   Report Status 01/26/2020 FINAL  Final   Organism ID, Bacteria ESCHERICHIA COLI (A)  Final      Susceptibility   Escherichia coli - MIC*    AMPICILLIN >=32 RESISTANT Resistant     CEFAZOLIN 8 SENSITIVE Sensitive     CEFTRIAXONE <=0.25 SENSITIVE Sensitive     CIPROFLOXACIN <=0.25 SENSITIVE Sensitive     GENTAMICIN <=1 SENSITIVE Sensitive     IMIPENEM <=0.25 SENSITIVE Sensitive     NITROFURANTOIN <=16 SENSITIVE Sensitive     TRIMETH/SULFA <=20 SENSITIVE Sensitive     AMPICILLIN/SULBACTAM >=32 RESISTANT Resistant     PIP/TAZO 64 INTERMEDIATE Intermediate     * >=100,000 COLONIES/mL  ESCHERICHIA COLI  Surgical pcr screen     Status: None   Collection Time: 01/25/20 10:48 PM   Specimen: Nasal Mucosa; Nasal Swab  Result Value Ref Range Status   MRSA, PCR NEGATIVE NEGATIVE Final   Staphylococcus aureus NEGATIVE NEGATIVE Final    Comment: (NOTE) The Xpert SA Assay (FDA approved for NASAL specimens in patients 79 years of age and older), is one component of a comprehensive surveillance program. It is not intended to diagnose infection nor to guide or monitor treatment. Performed at [facility], [address] [place], [place] [address]       Radiology Studies: DG C-Arm 1-60 Min  Result Date: 01/26/2020 CLINICAL DATA:  Portable operative imaging provided for left intertrochanteric proximal femur fracture ORIF. EXAM: LEFT FEMUR 2 VIEWS; DG C-ARM 1-60 MIN COMPARISON:  01/24/2020 FINDINGS: Five portable images show placement of an intramedullary rod supporting a compression screw, reducing the fracture components into near anatomic alignment. The orthopedic hardware is well-seated. IMPRESSION: Well aligned right proximal femur intertrochanteric fracture following ORIF. Electronically Signed   By: Amie Portland M.D.   On: 01/26/2020 14:37   DG FEMUR MIN 2 VIEWS LEFT  Result Date: 01/26/2020 CLINICAL DATA:  Portable operative imaging provided for left intertrochanteric proximal femur fracture ORIF. EXAM: LEFT FEMUR 2 VIEWS; DG C-ARM 1-60 MIN COMPARISON:  01/24/2020 FINDINGS: Five portable images show placement of an intramedullary rod supporting a compression screw, reducing the fracture components into near anatomic alignment. The orthopedic hardware is well-seated. IMPRESSION: Well aligned right proximal femur intertrochanteric fracture following ORIF. Electronically Signed   By: Amie Portland M.D.   On: 01/26/2020 14:37     Scheduled Meds: . acetaminophen  650 mg Oral Q6H  . apixaban  2.5 mg Oral BID  . docusate sodium  100 mg Oral BID  . metoprolol tartrate  25 mg  Oral TID  . sulfamethoxazole-trimethoprim  1 tablet Oral Q12H   Continuous Infusions:   LOS: 3 days    Reva Bores, MD 01/27/2020 4:15 PM [phone] [facility] If 7PM-7AM, please contact night-coverage 01/27/2020, 4:15 PM

## 2020-01-27 NOTE — Plan of Care (Signed)
  Problem: Coping: Goal: Level of anxiety will decrease Outcome: Progressing   Problem: Pain Managment: Goal: General experience of comfort will improve Outcome: Progressing   Problem: Safety: Goal: Ability to remain free from injury will improve Outcome: Progressing   Problem: Skin Integrity: Goal: Risk for impaired skin integrity will decrease Outcome: Progressing   

## 2020-01-27 NOTE — Progress Notes (Signed)
Subjective: 1 Day Post-Op Procedure(s) (LRB): INTRAMEDULLARY (IM) NAIL INTERTROCHANTRIC (Left) Patient reports pain as mild.   Patient seen in rounds today for Dr.Rogers Patient has some confusion when I wake her up in bed, but reports feeling well  Objective: Vital signs in last 24 hours: Temp:  [97.6 F (36.4 C)-98.4 F (36.9 C)] 98.2 F (36.8 C) (09/05 0404) Pulse Rate:  [71-103] 103 (09/05 0404) Resp:  [15-19] 17 (09/05 0404) BP: (101-154)/(63-92) 154/82 (09/05 0404) SpO2:  [91 %-99 %] 98 % (09/05 0404)  Intake/Output from previous day: 09/04 0701 - 09/05 0700 In: 1072 [I.V.:972; IV Piggyback:100] Out: 600 [Urine:550; Blood:50] Intake/Output this shift: No intake/output data recorded.  Recent Labs    01/24/20 1836 01/26/20 0903 01/27/20 0313  HGB 12.8 11.7* 10.7*   Recent Labs    01/26/20 0903 01/27/20 0313  WBC 16.9* 14.1*  RBC 3.56* 3.22*  HCT 35.3* 32.1*  PLT 186 174   Recent Labs    01/26/20 0903 01/27/20 0313  NA 137 137  K 4.1 4.6  CL 101 103  CO2 26 27  BUN 30* 34*  CREATININE 1.01* 1.11*  GLUCOSE 127* 135*  CALCIUM 8.8* 8.6*   Recent Labs    01/24/20 1836  INR 1.1   Patient is alert to patient and place Neurovascular intact Sensation intact distally Intact pulses distally Dorsiflexion/Plantar flexion intact Incision: dressing C/D/I Compartment soft able to flex and extend knee   Assessment/Plan: 1 Day Post-Op Procedure(s) (LRB): INTRAMEDULLARY (IM) NAIL INTERTROCHANTRIC (Left) Advance diet Up with therapy, WBAT on right lower extremity DVT prophylaxis: Eliquis Patient will follow up in 2 weeks in the office with Dr. Mikey College, PA-C [facility] [phone] 01/27/2020, 8:04 AM

## 2020-01-27 NOTE — Progress Notes (Addendum)
Orthopedic Tech Progress Note Patient Details:  [name] [birth year] [phone] RN called requesting an Over Head Frame with Trapeze for patient. went to apply it and her bed doesn't have the piece to for the TRAPEZE to go on. Old frame. I did notify RN Patient ID: [name], female   DOB: 1932-09-12, 86 y.o.   MRN: [id number]   Donald Pore 01/27/2020, 12:27 PM

## 2020-01-27 NOTE — Progress Notes (Signed)
ANTICOAGULATION CONSULT NOTE - Follow Up Consult  Pharmacy Consult for apixaban Indication: atrial fibrillation, left femur frature  No Known Allergies  Vital Signs: Temp: 98.2 F (36.8 C) (09/05 0404) Temp Source: Oral (09/05 0404) BP: 154/82 (09/05 0404) Pulse Rate: 103 (09/05 0404)  Labs: Recent Labs    01/24/20 1836 01/24/20 1836 01/24/20 1950 01/26/20 0903 01/27/20 0313  HGB 12.8   < >  --  11.7* 10.7*  HCT 38.5  --   --  35.3* 32.1*  PLT 217  --   --  186 174  LABPROT 13.6  --   --   --   --   INR 1.1  --   --   --   --   CREATININE  --   --  0.79 1.01* 1.11*   < > = values in this interval not displayed.    CrCl cannot be calculated (Unknown ideal weight.).   Medications:  Medications Prior to Admission  Medication Sig Dispense Refill Last Dose   acetaminophen (TYLENOL) 650 MG CR tablet Take 650 mg by mouth in the morning and at bedtime.    01/24/2020 at am   apixaban (ELIQUIS) 2.5 MG TABS tablet Take 1 tablet (2.5 mg total) by mouth 2 (two) times daily. 60 tablet 3 01/24/2020 at 0800   Calcium Carb-Cholecalciferol (CALTRATE 600+D3 SOFT) 600-800 MG-UNIT CHEW Chew 1 tablet by mouth in the morning and at bedtime.   01/24/2020 at am   feeding supplement (BOOST HIGH PROTEIN) LIQD Take 1 Container by mouth in the morning.   01/24/2020 at am   losartan (COZAAR) 50 MG tablet Take 25 mg by mouth in the morning.    01/24/2020 at am   metoprolol succinate (TOPROL-XL) 50 MG 24 hr tablet Take 50 mg by mouth in the morning.    01/24/2020 at 0800   NON FORMULARY Take 1 tablet by mouth See admin instructions. Vitafusion Women's Gummy Vitamins- Chew 1 gummie by mouth 2 times a day- morning and night   01/24/2020 at am   NON FORMULARY Pair of compression stockings 15-18mm Hg   N/A   sulfamethoxazole-trimethoprim (BACTRIM DS) 800-160 MG tablet Take 1 tablet by mouth in the morning and at bedtime. FOR 5 DAYS   Not yet at Not yet    Assessment: 86 yo female s/p fall with left hip  fracture on apixaban PTA for afib. Consult for restarting apixaban POD1 (9/5) if hemoglobin stable and > 8.5.  -Hg= 10.7, SCr= 1.0 -wt= 57.2 (01/24/20 in clinic)  PTA apixiban dose: 2.5mg  po bid  Goal of Therapy:  Monitor platelets by anticoagulation protocol: Yes   Plan:   -restarted apixaban 2.5mg  po bid on this morning - POD1 as Hemoglobin > 8.5.  Margarite Gouge, PharmD PGY2 ID Pharmacy Resident Phone between 7 am - 3:30 pm: [phone]  Please check AMION for all [facility] Pharmacy phone numbers After 10:00 PM, call Main Pharmacy [phone]

## 2020-01-28 LAB — BASIC METABOLIC PANEL
Anion gap: 5 (ref 5–15)
BUN: 40 mg/dL — ABNORMAL HIGH (ref 8–23)
CO2: 26 mmol/L (ref 22–32)
Calcium: 8.1 mg/dL — ABNORMAL LOW (ref 8.9–10.3)
Chloride: 103 mmol/L (ref 98–111)
Creatinine, Ser: 1.31 mg/dL — ABNORMAL HIGH (ref 0.44–1.00)
GFR calc Af Amer: 43 mL/min — ABNORMAL LOW (ref >60)
GFR calc non Af Amer: 37 mL/min — ABNORMAL LOW (ref >60)
Glucose, Bld: 103 mg/dL — ABNORMAL HIGH (ref 70–99)
Potassium: 4.4 mmol/L (ref 3.5–5.1)
Sodium: 134 mmol/L — ABNORMAL LOW (ref 135–145)

## 2020-01-28 LAB — CBC
HCT: 29.1 % — ABNORMAL LOW (ref 36.0–46.0)
Hemoglobin: 9.5 g/dL — ABNORMAL LOW (ref 12.0–15.0)
MCH: 32.9 pg (ref 26.0–34.0)
MCHC: 32.6 g/dL (ref 30.0–36.0)
MCV: 100.7 fL — ABNORMAL HIGH (ref 80.0–100.0)
Platelets: 169 10*3/uL (ref 150–400)
RBC: 2.89 MIL/uL — ABNORMAL LOW (ref 3.87–5.11)
RDW: 12.3 % (ref 11.5–15.5)
WBC: 14.4 10*3/uL — ABNORMAL HIGH (ref 4.0–10.5)
nRBC: 0 % (ref 0.0–0.2)

## 2020-01-28 LAB — VITAMIN B12: Vitamin B-12: 517 pg/mL (ref 180–914)

## 2020-01-28 LAB — FOLATE: Folate: 21 ng/mL (ref >5.9)

## 2020-01-28 LAB — FERRITIN: Ferritin: 223 ng/mL (ref 11–307)

## 2020-01-28 MED ORDER — METOPROLOL TARTRATE 25 MG PO TABS
25.0000 mg | ORAL_TABLET | Freq: Two times a day (BID) | ORAL | Status: DC
  Administered 2020-01-29: 25 mg via ORAL
  Filled 2020-01-28 (×2): qty 1

## 2020-01-28 NOTE — Progress Notes (Signed)
   01/28/20 2145  Provider Notification  Provider Name/Title Doreatha Martin  Date Provider Notified 01/28/20  Time Provider Notified 2145  Notification Type Page  Notification Reason Other (Comment) (BP 93/63, asked if to hold metoprolol)  Response Other (Comment) (waiting)

## 2020-01-28 NOTE — Plan of Care (Signed)
  Problem: Pain Managment: Goal: General experience of comfort will improve Outcome: Progressing   Problem: Safety: Goal: Ability to remain free from injury will improve Outcome: Progressing   Problem: Skin Integrity: Goal: Risk for impaired skin integrity will decrease Outcome: Progressing   

## 2020-01-28 NOTE — Progress Notes (Signed)
° ° ° °  Subjective: 2 Days Post-Op Procedure(s) (LRB): INTRAMEDULLARY (IM) NAIL INTERTROCHANTRIC (Left)   Patient reports pain as mild, pain controlled. No reported events throughout the night.     Objective:   VITALS:   Vitals:   01/27/20 1926 01/28/20 0457  BP: 90/60 96/69  Pulse: 99 100  Resp: 17 16  Temp: 98.1 F (36.7 C) 98.1 F (36.7 C)  SpO2: 97% 99%    Dorsiflexion/Plantar flexion intact Incision: dressing C/D/I No cellulitis present Compartment soft  LABS Recent Labs    01/26/20 0903 01/27/20 0313 01/28/20 0404  HGB 11.7* 10.7* 9.5*  HCT 35.3* 32.1* 29.1*  WBC 16.9* 14.1* 14.4*  PLT 186 174 169    Recent Labs    01/26/20 0903 01/27/20 0313 01/28/20 0404  NA 137 137 134*  K 4.1 4.6 4.4  BUN 30* 34* 40*  CREATININE 1.01* 1.11* 1.31*  GLUCOSE 127* 135* 103*     Assessment/Plan: 2 Days Post-Op Procedure(s) (LRB): INTRAMEDULLARY (IM) NAIL INTERTROCHANTRIC (Left)   Up with therapy  Weight bearing as tolerated  Return in 2 weeks to the clinic for staple removal  Resume her preoperative Eliquis DVT prophylaxis       Lanney Gins PA-C  [facility]   [place] [address], [address], [place], [place] [address] Phone: [phone] [email or web address] Facebook   [employer]

## 2020-01-28 NOTE — Plan of Care (Signed)

## 2020-01-28 NOTE — Progress Notes (Addendum)
Patient ID: [name], female   DOB: 1932/11/12, 86 y.o.   MRN: [id number]  PROGRESS NOTE    [name]  VOJ:[id number] DOB: August 08, 1932 DOA: 01/24/2020 PCP: Delorse Lek, MD    Brief Narrative:  86 year old female past medical history for hypertension, chronic kidney disease stage IIIa, vascular dementia, atrial fibrillation, pulmonary fibrosisand history of carotid artery disease. Patient reported urinary incontinence for about 2 weeks, as an outpatient she was diagnosed with urinarytract infection, she was placed on Bactrim. She sustained a mechanical fall from her own height while walking. Post trauma she had severe left hip pain, left lower extremity deformity, andshe was unable to bear weight or ambulate.   Pelvic x-ray with mildly displaced, comminuted and slightly valgus angulated intertrochanteric left femur fracture.  She developed atrial fibrillation with rapid ventricular response that required IV diltiazem infusion for rate control.   She underwent intramedullary implant for treatment on 01/26/2020.   Assessment & Plan:   Principal Problem:   Closed left hip fracture, initial encounter [facility]) Active Problems:   Permanent atrial fibrillation (HCC)   Acute lower UTI   HTN (hypertension)   Pulmonary fibrosis (HCC)  1. Acute intertrochanteric left femur fracture. Status post intramedullary implant on 01/26/2020. She has been up with physical therapy today. She denies significant hip pain.  2. Chronic permanent atrial fibrillation/ NEW RVR. Increase metoprolol to 50 mg po tid, decreased today to twice daily due to blood pressure in the 90/60 range. Eliquis  3. Urinary tract infection.  Positive urine culture for E. Coli.  Bactrim double strength twice daily x3 days complete  4. Chronic pulmonary fibrosis. No acute respiratory issues Continue to monitor  5. HTN. Holding losartan as blood pressures are soft.  Decrease metoprolol back to twice  daily.  6. CKD stage 3a. Base serum cr at 1.0. Today is 1.3 continue to trend avoid hypotension and nephrotoxic medications.  7. Acute blood loss anemia Status post fracture with operative fix. We will continue to trend hemoglobin. Hemoglobin continues to decrease down to 9 today.  Anemia panel ordered.    DVT prophylaxis: XH:[id number] Code Status: Full code  Family Communication: Patient at bedside Disposition Plan: SNF  Patient remains inpatient due to unsafe discharge plan, will need SNF.  Consultants:   Orthopedics  Procedures:  Intramedullary implant, ORIF of left  Antimicrobials: Anti-infectives (From admission, onward)   Start     Dose/Rate Route Frequency Ordered Stop   01/26/20 1200  ceFAZolin (ANCEF) IVPB 2g/100 mL premix        2 g 200 mL/hr over 30 Minutes Intravenous To [facility] 01/25/20 2246 01/26/20 1349   01/26/20 0600  ceFAZolin (ANCEF) IVPB 2g/100 mL premix  Status:  Discontinued        2 g 200 mL/hr over 30 Minutes Intravenous On call to O.R. 01/26/20 0103 01/26/20 0110   01/25/20 1500  sulfamethoxazole-trimethoprim (BACTRIM DS) 800-160 MG per tablet 1 tablet        1 tablet Oral Every 12 hours 01/25/20 1433 01/27/20 2320   01/24/20 2130  cefTRIAXone (ROCEPHIN) 1 g in sodium chloride 0.9 % 100 mL IVPB  Status:  Discontinued        1 g 200 mL/hr over 30 Minutes Intravenous Every 24 hours 01/24/20 2127 01/25/20 1433       Subjective: Feels well today.  She has no new complaints  Objective: Vitals:   01/27/20 0800 01/27/20 1454 01/27/20 1926 01/28/20 0457  BP: 127/66 (!) 97/58 90/60  96/69  Pulse: 96 94 99 100  Resp: 17 16 17 16   Temp: 98.1 F (36.7 C) 98 F (36.7 C) 98.1 F (36.7 C) 98.1 F (36.7 C)  TempSrc: Oral Oral Oral Oral  SpO2: 99% 95% 97% 99%    Intake/Output Summary (Last 24 hours) at 01/28/2020 1453 Last data filed at 01/28/2020 1121 Gross per 24 hour  Intake 480 ml  Output 1050 ml  Net -570 ml   There were no  vitals filed for this visit.  Examination:  General exam: Appears calm and comfortable  Respiratory system: Clear to auscultation. Respiratory effort normal. Cardiovascular system: S1 & S2 heard, irregularly irregular.  Gastrointestinal system: Abdomen is nondistended, soft and nontender.  Central nervous system: Alert and oriented. No focal neurological deficits. Extremities: Symmetric  Skin: No rashes Psychiatry: Judgement and insight appear normal. Mood & affect appropriate.     Data Reviewed: I have personally reviewed following labs and imaging studies  CBC: Recent Labs  Lab 01/24/20 1836 01/26/20 0903 01/27/20 0313 01/28/20 0404  WBC 9.4 16.9* 14.1* 14.4*  NEUTROABS  --  14.4* 12.6*  --   HGB 12.8 11.7* 10.7* 9.5*  HCT 38.5 35.3* 32.1* 29.1*  MCV 101.6* 99.2 99.7 100.7*  PLT 217 186 174 169   Basic Metabolic Panel: Recent Labs  Lab 01/24/20 1950 01/26/20 0903 01/27/20 0313 01/28/20 0404  NA 137 137 137 134*  K 4.5 4.1 4.6 4.4  CL 111 101 103 103  CO2 18* 26 27 26   GLUCOSE 97 127* 135* 103*  BUN 31* 30* 34* 40*  CREATININE 0.79 1.01* 1.11* 1.31*  CALCIUM 6.5* 8.8* 8.6* 8.1*   GFR: CrCl cannot be calculated (Unknown ideal weight.). Liver Function Tests: Recent Labs  Lab 01/24/20 1950  AST 35  ALT 8  ALKPHOS 24*  BILITOT 1.1  PROT 4.2*  ALBUMIN 2.1*   Coagulation Profile: Recent Labs  Lab 01/24/20 1836  INR 1.1     Recent Results (from the past 240 hour(s))  SARS Coronavirus 2 by RT PCR (hospital order, performed in [facility] hospital lab) Nasopharyngeal Nasopharyngeal Swab     Status: None   Collection Time: 01/24/20  6:55 PM   Specimen: Nasopharyngeal Swab  Result Value Ref Range Status   SARS Coronavirus 2 NEGATIVE NEGATIVE Final    Comment: (NOTE) SARS-CoV-2 target nucleic acids are NOT DETECTED.  The SARS-CoV-2 RNA is generally detectable in upper and lower respiratory specimens during the acute phase of infection. The  lowest concentration of SARS-CoV-2 viral copies this assay can detect is 250 copies / mL. A negative result does not preclude SARS-CoV-2 infection and should not be used as the sole basis for treatment or other patient management decisions.  A negative result may occur with improper specimen collection / handling, submission of specimen other than nasopharyngeal swab, presence of viral mutation(s) within the areas targeted by this assay, and inadequate number of viral copies (<250 copies / mL). A negative result must be combined with clinical observations, patient history, and epidemiological information.  Fact Sheet for Patients:   [facility]  Fact Sheet for Healthcare Providers: 03/25/20  This test is not yet approved or  cleared by the [employer] FDA and has been authorized for detection and/or diagnosis of SARS-CoV-2 by FDA under an Emergency Use Authorization (EUA).  This EUA will remain in effect (meaning this test can be used) for the duration of the COVID-19 declaration under Section 564(b)(1) of the Act, 21 U.S.C. section 360bbb-3(b)(1), unless  the authorization is terminated or revoked sooner.  Performed at [facility], [address] [place], [place] [address]   Culture, Urine     Status: Abnormal   Collection Time: 01/24/20 10:42 PM   Specimen: Urine, Random  Result Value Ref Range Status   Specimen Description URINE, RANDOM  Final   Special Requests   Final    NONE Performed at [facility], [address] [place], [place] [address]    Culture >=100,000 COLONIES/mL ESCHERICHIA COLI (A)  Final   Report Status 01/26/2020 FINAL  Final   Organism ID, Bacteria ESCHERICHIA COLI (A)  Final      Susceptibility   Escherichia coli - MIC*    AMPICILLIN >=32 RESISTANT Resistant     CEFAZOLIN 8 SENSITIVE Sensitive     CEFTRIAXONE <=0.25 SENSITIVE Sensitive     CIPROFLOXACIN <=0.25 SENSITIVE  Sensitive     GENTAMICIN <=1 SENSITIVE Sensitive     IMIPENEM <=0.25 SENSITIVE Sensitive     NITROFURANTOIN <=16 SENSITIVE Sensitive     TRIMETH/SULFA <=20 SENSITIVE Sensitive     AMPICILLIN/SULBACTAM >=32 RESISTANT Resistant     PIP/TAZO 64 INTERMEDIATE Intermediate     * >=100,000 COLONIES/mL ESCHERICHIA COLI  Surgical pcr screen     Status: None   Collection Time: 01/25/20 10:48 PM   Specimen: Nasal Mucosa; Nasal Swab  Result Value Ref Range Status   MRSA, PCR NEGATIVE NEGATIVE Final   Staphylococcus aureus NEGATIVE NEGATIVE Final    Comment: (NOTE) The Xpert SA Assay (FDA approved for NASAL specimens in patients 98 years of age and older), is one component of a comprehensive surveillance program. It is not intended to diagnose infection nor to guide or monitor treatment. Performed at [facility], [address], [place], [place] [address]       Radiology Studies: No results found.   Scheduled Meds: . acetaminophen  650 mg Oral Q6H  . apixaban  2.5 mg Oral BID  . docusate sodium  100 mg Oral BID  . metoprolol tartrate  25 mg Oral TID   Continuous Infusions:   LOS: 4 days    Reva Bores, MD 01/28/2020 2:53 PM [phone] [facility] If 7PM-7AM, please contact night-coverage 01/28/2020, 2:53 PM

## 2020-01-29 ENCOUNTER — Encounter (HOSPITAL_COMMUNITY): Payer: Self-pay | Admitting: Orthopedic Surgery

## 2020-01-29 LAB — COMPREHENSIVE METABOLIC PANEL
ALT: 11 U/L (ref 0–44)
AST: 16 U/L (ref 15–41)
Albumin: 2.2 g/dL — ABNORMAL LOW (ref 3.5–5.0)
Alkaline Phosphatase: 30 U/L — ABNORMAL LOW (ref 38–126)
Anion gap: 6 (ref 5–15)
BUN: 31 mg/dL — ABNORMAL HIGH (ref 8–23)
CO2: 25 mmol/L (ref 22–32)
Calcium: 8.2 mg/dL — ABNORMAL LOW (ref 8.9–10.3)
Chloride: 103 mmol/L (ref 98–111)
Creatinine, Ser: 1.17 mg/dL — ABNORMAL HIGH (ref 0.44–1.00)
GFR calc Af Amer: 49 mL/min — ABNORMAL LOW (ref >60)
GFR calc non Af Amer: 42 mL/min — ABNORMAL LOW (ref >60)
Glucose, Bld: 114 mg/dL — ABNORMAL HIGH (ref 70–99)
Potassium: 4.4 mmol/L (ref 3.5–5.1)
Sodium: 134 mmol/L — ABNORMAL LOW (ref 135–145)
Total Bilirubin: 0.7 mg/dL (ref 0.3–1.2)
Total Protein: 4.9 g/dL — ABNORMAL LOW (ref 6.5–8.1)

## 2020-01-29 LAB — CBC
HCT: 29.8 % — ABNORMAL LOW (ref 36.0–46.0)
Hemoglobin: 9.7 g/dL — ABNORMAL LOW (ref 12.0–15.0)
MCH: 32.4 pg (ref 26.0–34.0)
MCHC: 32.6 g/dL (ref 30.0–36.0)
MCV: 99.7 fL (ref 80.0–100.0)
Platelets: 175 10*3/uL (ref 150–400)
RBC: 2.99 MIL/uL — ABNORMAL LOW (ref 3.87–5.11)
RDW: 12.1 % (ref 11.5–15.5)
WBC: 13.2 10*3/uL — ABNORMAL HIGH (ref 4.0–10.5)
nRBC: 0 % (ref 0.0–0.2)

## 2020-01-29 MED ORDER — METOPROLOL TARTRATE 50 MG PO TABS
50.0000 mg | ORAL_TABLET | Freq: Two times a day (BID) | ORAL | Status: DC
  Administered 2020-01-29: 50 mg via ORAL
  Filled 2020-01-29 (×2): qty 1

## 2020-01-29 NOTE — Progress Notes (Signed)
PROGRESS NOTE    [name]  XIP:[id number] DOB: [birth year] DOA: 01/24/2020 PCP: Delorse Lek, MD    Brief Narrative:  Patient admitted to the hospital with a working diagnosis of left intertrochanteric fracture. Complicated with atrial fibrillation with rapid ventricular response.   86 year old female past medical history for hypertension, chronic kidney disease stage IIIa, vascular dementia, atrial fibrillation, pulmonary fibrosisand history of carotid artery disease. Patient reported urinary incontinence for about 2 weeks, as an outpatient she was diagnosed with urinary tract infection, she was placed on Bactrim. She sustained a mechanical fall from her own height while walking. Post trauma she had severe left hip pain, left lower extremity deformity, andshe was unable to bear weight or ambulate. On her initial physical examination blood pressure 140/84, heart rate 91, respiratory rate 18, temperature 97.4, oxygen saturation 94%. Her lungs were clear to auscultation bilaterally, heart S1-S2, present rhythmic, soft abdomen, no lower extremity edema, she had left hip tenderness.  Sodium 137, potassium 4.5, chloride 111, bicarb 18, glucose 97, BUN 31, creatinine 0.79, white count 9.4, hemoglobin 12.8, hematocrit 38.5, platelets 217. SARS COVID-19 negative. Urinalysis 11-20 white cells, 6-10 red cells, specific gravity 1.015, positive nitrates. Head CT no acute changes.  Pelvic x-ray with mildly displaced, comminuted and slightly valgus angulated intertrochanteric left femur fracture. Chest radiograph with bilateral increased lung markings/fibrosis. EKG 97 bpm, left axis deviation, left anterior fascicular block, normal QTC, atrial fibrillation rhythm, no ST segment or T wave changes  Patient placed on analgesics for pain control and orthopedics was consulted. She developed atrial fibrillation with rapid ventricular response that required IV diltiazem infusion for rate control.  Metoprolol dose has been increased, holding on apixaban for orthopedic procedure.   Patient's mobility is impaired due to ambulatory dysfunction, at baseline with no angina. Patient has moderate cardiovascular risk for orthopedic procedure.     Assessment & Plan:   Principal Problem:   Closed left hip fracture, initial encounter [facility]) Active Problems:   Permanent atrial fibrillation (HCC)   Acute lower UTI   HTN (hypertension)   Pulmonary fibrosis (HCC)   1. Acute intertrochanteric left femur fracture. Continue pain control with scheduled acetaminophen, plus as needed hydrocodone. Has not used recently IV morphine, will dc this medication.  Continue with as needed methocarbamol.  Patient will be transferred to SNF at discharge to continue with physical therapy.   2. Chronic permanent atrial fibrillation/ NEW RVR.  Heart rate in the 100's at rest.  Will replace telemetry monitoring for better heart rate monitoring and adjustment of b blocker, metoprolol. Currently on 25 mg po bid. Continue anticoagulation with apixaban.   3. Urinary tract infection. Completed therapy with 3 days of bactrim.    4. Chronic pulmonary fibrosis. Echocardiogram from 06/21 with increased RV systolic pressure and mild reduction in RV systolic function, consistent with pulmonary hypertension.   Denies dyspnea, oxymetry is 96 to 95 on room air.   5. HTN.  blood pressure today 112/64 and 111/73, continue holding antihypertensive medications for now.   6. AKI on CKD stage 3a. Today with serum cr down to 1,17 from peak 1,31. K at 4,4 and serum bicarbonate at 25. Patient tolerating po well, continue to hold on antihypertensive medications.     Status is: Inpatient  Remains inpatient appropriate because:IV treatments appropriate due to intensity of illness or inability to take PO and Inpatient level of care appropriate due to severity of illness   Dispo: The patient is from: Home  Anticipated d/c is to: SNF              Anticipated d/c date is: 1 day              Patient currently is not medically stable to d/c.   DVT prophylaxis: apixaban   Code Status:   full  Family Communication:  I spoke with patient's daughter at the bedside, we talked in detail about patient's condition, plan of care and prognosis and all questions were addressed.      Nutrition Status: Nutrition Problem: Increased nutrient needs Etiology: post-op healing, hip fracture Signs/Symptoms: estimated needs Interventions: Refer to RD note for recommendations     Skin Documentation:     Consultants:   Orthopedics   Procedures:  Treatment of intertrochanteric, pertrochanteric, subtrochanteric fracture with intramedullary implant. CPT [phone]      Subjective: Patient with no chest pain or dyspnea, positive hip pain when moving, no nausea or vomiting.   Objective: Vitals:   01/28/20 1921 01/28/20 2207 01/29/20 0534 01/29/20 0808  BP: 93/63 (!) 98/59 112/64 111/73  Pulse: 76 91 (!) 106 (!) 110  Resp: 17  16 18   Temp: 98.1 F (36.7 C)  98 F (36.7 C) 98.3 F (36.8 C)  TempSrc: Oral  Oral Oral  SpO2: 100%  96% 95%    Intake/Output Summary (Last 24 hours) at 01/29/2020 1251 Last data filed at 01/29/2020 0500 Gross per 24 hour  Intake 200 ml  Output 900 ml  Net -700 ml   There were no vitals filed for this visit.  Examination:   General: Not in pain or dyspnea, deconditioned  Neurology: Awake and alert, non focal  E ENT: mild pallor, no icterus, oral mucosa moist Cardiovascular: No JVD. S1-S2 present, rhythmic, no gallops, rubs, or murmurs. No lower extremity edema. Pulmonary: positive breath sounds bilaterally, adequate air movement, no wheezing, rhonchi or rales. Gastrointestinal. Abdomen soft and non tender Skin. No rashes Musculoskeletal: no joint deformities     Data Reviewed: I have personally reviewed following labs and imaging studies  CBC: Recent Labs    Lab 06-Feb-2020 1836 01/26/20 0903 01/27/20 0313 01/28/20 0404 01/29/20 0235  WBC 9.4 16.9* 14.1* 14.4* 13.2*  NEUTROABS  --  14.4* 12.6*  --   --   HGB 12.8 11.7* 10.7* 9.5* 9.7*  HCT 38.5 35.3* 32.1* 29.1* 29.8*  MCV 101.6* 99.2 99.7 100.7* 99.7  PLT 217 186 174 169 175   Basic Metabolic Panel: Recent Labs  Lab 02-06-20 1950 01/26/20 0903 01/27/20 0313 01/28/20 0404 01/29/20 0235  NA 137 137 137 134* 134*  K 4.5 4.1 4.6 4.4 4.4  CL 111 101 103 103 103  CO2 18* 26 27 26 25   GLUCOSE 97 127* 135* 103* 114*  BUN 31* 30* 34* 40* 31*  CREATININE 0.79 1.01* 1.11* 1.31* 1.17*  CALCIUM 6.5* 8.8* 8.6* 8.1* 8.2*   GFR: CrCl cannot be calculated (Unknown ideal weight.). Liver Function Tests: Recent Labs  Lab 2020-02-06 1950 01/29/20 0235  AST 35 16  ALT 8 11  ALKPHOS 24* 30*  BILITOT 1.1 0.7  PROT 4.2* 4.9*  ALBUMIN 2.1* 2.2*   No results for input(s): LIPASE, AMYLASE in the last 168 hours. No results for input(s): AMMONIA in the last 168 hours. Coagulation Profile: Recent Labs  Lab 06-Feb-2020 1836  INR 1.1   Cardiac Enzymes: No results for input(s): CKTOTAL, CKMB, CKMBINDEX, TROPONINI in the last 168 hours. BNP (last 3 results) No results for input(s): PROBNP in  the last 8760 hours. HbA1C: No results for input(s): HGBA1C in the last 72 hours. CBG: No results for input(s): GLUCAP in the last 168 hours. Lipid Profile: No results for input(s): CHOL, HDL, LDLCALC, TRIG, CHOLHDL, LDLDIRECT in the last 72 hours. Thyroid Function Tests: No results for input(s): TSH, T4TOTAL, FREET4, T3FREE, THYROIDAB in the last 72 hours. Anemia Panel: Recent Labs    01/28/20 1730  VITAMINB12 517  FOLATE 21.0  FERRITIN 223      Radiology Studies: I have reviewed all of the imaging during this hospital visit personally     Scheduled Meds:  acetaminophen  650 mg Oral Q6H   apixaban  2.5 mg Oral BID   docusate sodium  100 mg Oral BID   metoprolol tartrate  25 mg Oral  BID   Continuous Infusions:   LOS: 5 days        Rahkim Rabalais Annett Gula, MD

## 2020-01-29 NOTE — TOC Initial Note (Addendum)
Transition of Care [facility]) - Initial/Assessment Note    Patient Details  Name: [name] MRN: [id number] Date of Birth: May 20, 1933  Transition of Care [facility]) CM/SW Contact:    Epifanio Lesches, RN Phone Number: 01/29/2020, 9:38 AM  Clinical Narrative:            Presents after falling, suffered L hip fx. Very HOH. From home alone. Supportive family.           - s/p Treatment of intertrochanteric, pertrochanteric, subtrochanteric fracture with intramedullary implant, 9/4  RNCM received consult for possible SNF placement at time of discharge. RNCM spoke with patient regarding PT recommendation of SNF placement at time of discharge. Patient reported that she is currently unable to care for self independently at home given current physical needs and fall risk. Patient expressed understanding of PT recommendation and is agreeable to SNF placement at time of discharge. Patient without  preference for SNF RNCM discussed insurance authorization process and provided Medicare SNF ratings list. Patient expressed being hopeful for rehab and to feel better soon. No further questions reported at this time. RNCM to continue to follow and assist with discharge planning needs.  Doreen Salvage (Daughter)       [phone]        Expected Discharge Plan: Skilled Nursing Facility Barriers to Discharge: Continued Medical Work up, No SNF bed   Patient Goals and CMS Choice Patient states their goals for this hospitalization and ongoing recovery are:: to get better CMS Medicare.gov Compare Post Acute Care list provided to:: Patient    Expected Discharge Plan and Services Expected Discharge Plan: Skilled Nursing Facility   Discharge Planning Services: CM Consult                                          Prior Living Arrangements/Services   Lives with:: Self                   Activities of Daily Living      Permission Sought/Granted                  Emotional  Assessment              Admission diagnosis:  Pre-op evaluation [Z01.818] Atrial fibrillation with RVR (HCC) [I48.91] Closed displaced intertrochanteric fracture of left femur, initial encounter (HCC) [S72.142A] Closed left hip fracture, initial encounter (HCC) [S72.002A] Patient Active Problem List   Diagnosis Date Noted  . Pulmonary fibrosis (HCC) 01/25/2020  . Closed left hip fracture, initial encounter (HCC) 01/24/2020  . Acute lower UTI 01/24/2020  . HTN (hypertension) 01/24/2020  . Permanent atrial fibrillation (HCC) 11/22/2019  . Persistent atrial fibrillation (HCC) 10/25/2019  . Secondary hypercoagulable state (HCC) 10/25/2019   PCP:  Delorse Lek, MD Pharmacy:   [phone] [place] [place] - [address] [place] [address] Phone: [phone] Fax: [phone]     Social Determinants of Health (SDOH) Interventions    Readmission Risk Interventions No flowsheet data found.

## 2020-01-29 NOTE — TOC CAGE-AID Note (Signed)
Transition of Care [facility]) - CAGE-AID Screening   Patient Details  Name: [name] MRN: [id number] Date of Birth: June 05, 1932  [facility]) CM/SW Contact:    Emeterio Reeve, [place] Phone Number: 01/29/2020, 12:41 PM   Clinical Narrative:  CSW met with pt at bedside. CSW introduced self and explained her role at the hospital.  PT denies alcohol use and substance use. Pt did not need any resources at this time.    CAGE-AID Screening:    Have You Ever Felt You Ought to Cut Down on Your Drinking or Drug Use?: No Have People Annoyed You By Critizing Your Drinking Or Drug Use?: No Have You Felt Bad Or Guilty About Your Drinking Or Drug Use?: No Have You Ever Had a Drink or Used Drugs First Thing In The Morning to Steady Your Nerves or to Get Rid of a Hangover?: No CAGE-AID Score: 0  Substance Abuse Education Offered: Yes    Blima Ledger, [place] Social Worker [phone]

## 2020-01-29 NOTE — Progress Notes (Signed)
Physical Therapy Treatment Patient Details Name: [name] MRN: [id number] DOB: 26-Jun-1932 Today's Date: 01/29/2020    History of Present Illness [name] is a 86 y.o. female with medical history significant of CKD 3, HTN, vascular dementia, A.Fib on eliquis, bilateral CEAs. Pt with recent diagnosis of UTI. Pt s/p fall and sustained L hip fracture. Pt s/p ORIF on 9/4 - L LE WBAT.    PT Comments    Pt supine in bed on arrival.  Pt required max assistance throughout session.  Pt continues to benefit from snf placement at d/c.  Continue to follow acutely.     Follow Up Recommendations  SNF;Supervision/Assistance - 24 hour     Equipment Recommendations  None recommended by PT (TBD at next venue)    Recommendations for Other Services       Precautions / Restrictions Precautions Precautions: Fall Precaution Comments: pt very confused Restrictions Weight Bearing Restrictions: Yes LLE Weight Bearing: Weight bearing as tolerated    Mobility  Bed Mobility Overal bed mobility: Needs Assistance Bed Mobility: Supine to Sit;Sit to Supine     Supine to sit: Max assist     General bed mobility comments: Max assistance to advance LEs and elevate trunk into a seated position.  Pt required assistance to scoot hips to edge of bed.  Transfers Overall transfer level: Needs assistance Equipment used: Rolling walker (2 wheeled) Transfers: Sit to/from Stand Sit to Stand: Mod assist;From elevated surface         General transfer comment: Cues for hand placement with noted posterior bias.  Pt required cues for hip, trunk and head and extension.  Ambulation/Gait Ambulation/Gait assistance: Max assist;+2 safety/equipment Gait Distance (Feet): 6 Feet Assistive device: Rolling walker (2 wheeled) Gait Pattern/deviations: Step-to pattern;Trunk flexed;Antalgic;Leaning posteriorly     General Gait Details: Cues for sequencing and trunk/hip extension.  Pt continues to lean  posteriorly and required physical management of RW to advance forward.  Close chair follow for safety.   Stairs             Wheelchair Mobility    Modified Rankin (Stroke Patients Only)       Balance Overall balance assessment: Needs assistance   Sitting balance-Leahy Scale: Poor       Standing balance-Leahy Scale: Zero Standing balance comment: dependent on physical assist                            Cognition Arousal/Alertness: Awake/alert Behavior During Therapy: WFL for tasks assessed/performed Overall Cognitive Status: History of cognitive impairments - at baseline                                 General Comments: per dtr in law "it comes and goes, sometimes she's with it, sometimes she's not"  Pt aware she fell but didn't know that she fractured her L LE or that she had surgery. Pt able to follow commands but with delayed processing      Exercises      General Comments        Pertinent Vitals/Pain Pain Assessment: Faces Faces Pain Scale: Hurts even more Pain Location: L hip and knee Pain Descriptors / Indicators: Grimacing;Guarding Pain Intervention(s): Monitored during session;Repositioned    Home Living                      Prior Function  PT Goals (current goals can now be found in the care plan section) Acute Rehab PT Goals PT Goal Formulation: With patient/family Potential to Achieve Goals: Good Progress towards PT goals: Progressing toward goals    Frequency    Min 3X/week      PT Plan Current plan remains appropriate    Co-evaluation              AM-PAC PT "6 Clicks" Mobility   Outcome Measure  Help needed turning from your back to your side while in a flat bed without using bedrails?: A Lot Help needed moving from lying on your back to sitting on the side of a flat bed without using bedrails?: A Lot Help needed moving to and from a bed to a chair (including a wheelchair)?: A  Lot Help needed standing up from a chair using your arms (e.g., wheelchair or bedside chair)?: A Lot Help needed to walk in hospital room?: Total Help needed climbing 3-5 steps with a railing? : Total 6 Click Score: 10    End of Session Equipment Utilized During Treatment: Gait belt Activity Tolerance: Patient tolerated treatment well;Patient limited by pain Patient left: in chair;with call bell/phone within reach;with chair alarm set;with family/visitor present Nurse Communication: Mobility status PT Visit Diagnosis: Unsteadiness on feet (R26.81)     Time: 2951-8841 PT Time Calculation (min) (ACUTE ONLY): 22 min  Charges:  $Therapeutic Activity: 8-22 mins                     [name] , PTA Acute Rehabilitation Services Pager [phone] Office [phone]     [name] 01/29/2020, 12:02 PM

## 2020-01-29 NOTE — Plan of Care (Signed)
  Problem: Pain Managment: Goal: General experience of comfort will improve Outcome: Progressing   Problem: Safety: Goal: Ability to remain free from injury will improve Outcome: Progressing   Problem: Skin Integrity: Goal: Risk for impaired skin integrity will decrease Outcome: Progressing   

## 2020-01-29 NOTE — Discharge Instructions (Addendum)
Orthopedic discharge instructions:  -Okay for full weightbearing as tolerated to the left lower extremity. -Maintain postoperative bandage until your follow-up appointment if able.  If this become saturated with blood or soiled, you may remove and replace with daily dry dressings.  You may begin showering on postoperative day #3.  Do not submerge underwater.  -Please resume your preoperative Eliquis which will also serve as your postoperative DVT prophylaxis.  -For pain control apply ice to the left hip for 30 minutes out of each hour around-the-clock, take Tylenol every 6 hours as directed, and use tramadol as needed for breakthrough pain.  -Return to see Dr. Aundria Rud in 2 weeks for routine postop care.    Information on my medicine - ELIQUIS (apixaban)  This medication education was reviewed with me or my healthcare representative as part of my discharge preparation.    Why was Eliquis prescribed for you? Eliquis was prescribed for you to reduce the risk of a blood clot forming that can cause a stroke if you have a medical condition called atrial fibrillation (a type of irregular heartbeat).  What do You need to know about Eliquis ? Take your Eliquis TWICE DAILY - one tablet in the morning and one tablet in the evening with or without food. If you have difficulty swallowing the tablet whole please discuss with your pharmacist how to take the medication safely.  Take Eliquis exactly as prescribed by your doctor and DO NOT stop taking Eliquis without talking to the doctor who prescribed the medication.  Stopping may increase your risk of developing a stroke.  Refill your prescription before you run out.  After discharge, you should have regular check-up appointments with your healthcare provider that is prescribing your Eliquis.  In the future your dose may need to be changed if your kidney function or weight changes by a significant amount or as you get older.  What do you do if you  miss a dose? If you miss a dose, take it as soon as you remember on the same day and resume taking twice daily.  Do not take more than one dose of ELIQUIS at the same time to make up a missed dose.  Important Safety Information A possible side effect of Eliquis is bleeding. You should call your healthcare provider right away if you experience any of the following: ? Bleeding from an injury or your nose that does not stop. ? Unusual colored urine (red or dark brown) or unusual colored stools (red or black). ? Unusual bruising for unknown reasons. ? A serious fall or if you hit your head (even if there is no bleeding).  Some medicines may interact with Eliquis and might increase your risk of bleeding or clotting while on Eliquis. To help avoid this, consult your healthcare provider or pharmacist prior to using any new prescription or non-prescription medications, including herbals, vitamins, non-steroidal anti-inflammatory drugs (NSAIDs) and supplements.  This website has more information on Eliquis (apixaban): http://www.eliquis.com/eliquis/home

## 2020-01-30 MED ORDER — SENNOSIDES-DOCUSATE SODIUM 8.6-50 MG PO TABS
1.0000 | ORAL_TABLET | Freq: Two times a day (BID) | ORAL | Status: DC
  Administered 2020-01-30 – 2020-02-01 (×5): 1 via ORAL
  Filled 2020-01-30 (×5): qty 1

## 2020-01-30 MED ORDER — METOPROLOL TARTRATE 50 MG PO TABS
75.0000 mg | ORAL_TABLET | Freq: Two times a day (BID) | ORAL | Status: DC
  Administered 2020-01-30 – 2020-01-31 (×3): 75 mg via ORAL
  Filled 2020-01-30 (×3): qty 1

## 2020-01-30 MED ORDER — ENSURE ENLIVE PO LIQD
237.0000 mL | Freq: Two times a day (BID) | ORAL | Status: DC
  Administered 2020-01-30: 237 mL via ORAL

## 2020-01-30 MED ORDER — ADULT MULTIVITAMIN W/MINERALS CH
1.0000 | ORAL_TABLET | Freq: Every day | ORAL | Status: DC
  Administered 2020-01-30 – 2020-02-01 (×3): 1 via ORAL
  Filled 2020-01-30 (×3): qty 1

## 2020-01-30 MED ORDER — BISACODYL 5 MG PO TBEC
5.0000 mg | DELAYED_RELEASE_TABLET | Freq: Once | ORAL | Status: AC
  Administered 2020-01-30: 5 mg via ORAL
  Filled 2020-01-30: qty 1

## 2020-01-30 NOTE — Progress Notes (Signed)
Notified [name] that pt HR is fluctuating from low 100's to 146. Pt had to urinate and could not remember what to do. RN sat pt up and pt urinated HR down to 124. Pt also has not had a documented  BM in 6 days. Given prune juice. Please advise. RN will continue to monitor.

## 2020-01-30 NOTE — Progress Notes (Signed)
Nutrition Follow-up  DOCUMENTATION CODES:   Not applicable  INTERVENTION:   -Ensure Enlive po BID, each supplement provides 350 kcal and 20 grams of protein -MVI with minerals daily  NUTRITION DIAGNOSIS:   Increased nutrient needs related to post-op healing, hip fracture as evidenced by estimated needs.  Ongoing  GOAL:   Patient will meet greater than or equal to 90% of their needs  Progressing  MONITOR:   Labs, Skin, Diet advancement, PO intake, Weight trends, Supplement acceptance, I & O's  REASON FOR ASSESSMENT:   Consult Assessment of nutrition requirement/status, Hip fracture protocol  ASSESSMENT:   86 year old female with history significant of vascular dementia, GERD, HLD, HTN, PVD, CAD, Afib on eliquis, pulmonary fibrosis, and CKD stage 3, and 2 week history of urinary incontinence who presented to PCP office on 9/2 and diagnosed with UTI. On the way to pick up antibiotics, pt sustained mechanical fall while walking. Patient presented with severe left hip pain and LLE deformity, admitted with acute intertrochanteric left femur fracture.  9/4- s/p Treatment of intertrochanteric, pertrochanteric, subtrochanteric fracture with intramedullary implant.  Reviewed I/O's: -260 ml x 24 hours and -1.6 L since admission  UOP: 500 ml x 24 hours  Spoke with pt and daughter at bedside. Pt daughter reports that pt is a very selective eater at baseline- she consumes 3 meals per day, however, often consumes the same foods repeatedly (including fish and rotisserie chicken sandwiches). Pt daughter has been ordering meals for pt and intake has been erratic. Noted meal completion 20-50%.   Per pt daughter, pt has a distant history of weight loss. Noted wt has been stable over the past 3 months.   Discussed importance of good meal and supplement intake to promote healing. Pt amenable to chocolate Ensure supplements.   Noted last documented BM on 01/24/20; pt on senna.   Per MD  notes, plan SNF placement at d/c.   Labs reviewed.   NUTRITION - FOCUSED PHYSICAL EXAM:    Most Recent Value  Orbital Region No depletion  Upper Arm Region Mild depletion  Thoracic and Lumbar Region No depletion  Buccal Region No depletion  Temple Region Mild depletion  Clavicle Bone Region No depletion  Clavicle and Acromion Bone Region No depletion  Scapular Bone Region No depletion  Dorsal Hand Mild depletion  Patellar Region No depletion  Anterior Thigh Region No depletion  Posterior Calf Region No depletion  Edema (RD Assessment) None  Hair Reviewed  Eyes Reviewed  Mouth Reviewed  Skin Reviewed  Nails Reviewed       Diet Order:   Diet Order            Diet regular Room service appropriate? Yes; Fluid consistency: Thin  Diet effective now                 EDUCATION NEEDS:   No education needs have been identified at this time  Skin:  Skin Assessment: Skin Integrity Issues: Skin Integrity Issues:: Incisions Incisions: lt hip Other: ecchymosis;L jaw  Last BM:  01/24/20  Height:   Ht Readings from Last 1 Encounters:  11/22/19 4\' 11"  (1.499 m)    Weight:   Wt Readings from Last 1 Encounters:  11/22/19 58.3 kg    Ideal Body Weight:  43.7 kg  BMI:  There is no height or weight on file to calculate BMI.  Estimated Nutritional Needs:   Kcal:  1500-1700  Protein:  75-85  Fluid:  >/= 1.4 L/day  Loistine Chance, RD, LDN, [place] Registered Dietitian II Certified Diabetes Care and Education Specialist Please refer to [facility] for RD and/or RD on-call/weekend/after hours pager

## 2020-01-30 NOTE — TOC Progression Note (Signed)
Transition of Care [facility]) - Progression Note    Patient Details  Name: [name] MRN: [id number] Date of Birth: 06/07/1932  Transition of Care [facility]) CM/SW Contact  Epifanio Lesches, RN Phone Number: [phone] 01/30/2020, 1:53 PM  Clinical Narrative:    Pt/family selected [facility] after receiving noted bed offers from [facility]. [facility] made [facility] admissions aware. [facility] to f/u with [facility] for bed availability. TOC team will continue to monitor for needs....  Expected Discharge Plan: Skilled Nursing Facility Barriers to Discharge: Continued Medical Work up  Expected Discharge Plan and Services Expected Discharge Plan: Skilled Nursing Facility   Discharge Planning Services: CM Consult                                           Social Determinants of Health (SDOH) Interventions    Readmission Risk Interventions No flowsheet data found.

## 2020-01-30 NOTE — Progress Notes (Signed)
PROGRESS NOTE    [name]  WUJ:[id number] DOB: Apr 27, 1933 DOA: 01/24/2020 PCP: [name] Lek, MD    Brief Narrative:  Patient admitted to the hospital with a working diagnosis of left intertrochanteric fracture.Complicated with atrial fibrillation with rapid ventricular response.  86 year old female past medical history for hypertension, chronic kidney disease stage IIIa, vascular dementia, atrial fibrillation, pulmonary fibrosisand history of carotid artery disease. Patient reported urinary incontinence for about 2 weeks, as an outpatient she was diagnosed with urinarytract infection, she was placed on Bactrim. She sustained a mechanical fall from her own height while walking. Post trauma she had severe left hip pain, left lower extremity deformity, andshe was unable to bear weight or ambulate. On her initial physical examination blood pressure 140/84, heart rate 91, respiratory rate 18, temperature 97.4, oxygen saturation 94%. Her lungswereclear to auscultation bilaterally, heart S1-S2, present rhythmic, soft abdomen, no lower extremity edema, she had left hip tenderness.  Sodium 137, potassium 4.5, chloride 111, bicarb 18, glucose 97, BUN 31, creatinine 0.79, white count 9.4, hemoglobin 12.8, hematocrit 38.5, platelets 217. SARS COVID-19 negative. Urinalysis 11-20 white cells, 6-10 red cells, specific gravity 1.015, positive nitrates. Head CT no acute changes.  Pelvic x-ray with mildly displaced, comminuted and slightly valgus angulated intertrochanteric left femur fracture. Chest radiograph with bilateral increased lung markings/fibrosis. EKG 97 bpm, left axis deviation, left anterior fascicular block, normal QTC, atrial fibrillation rhythm, no ST segment or T wave changes  Patient placed on analgesics for pain control and orthopedics was consulted. She developed atrial fibrillation with rapid ventricular response that required IV diltiazem infusion for rate control.  Metoprolol dose has been increased, holding on apixaban for orthopedic procedure.  Patient's mobility is impaired due to ambulatory dysfunction, at baseline with no angina. Patient has moderate cardiovascular risk for orthopedic procedure.   Had episodic RVR related to atrial fibrillation. Metoprolol dose has been increased with good toleration.   Pending transfer to SNF.    Assessment & Plan:   Principal Problem:   Closed left hip fracture, initial encounter [facility]) Active Problems:   Permanent atrial fibrillation (HCC)   Acute lower UTI   HTN (hypertension)   Pulmonary fibrosis (HCC)    1. Acute intertrochanteric left femur fracture. Pain controlled withscheduled acetaminophen, and PRN hydrocodone. As needed methocarbamol for muscle relaxant.   Patient will be transferred to SNF to continue physical and occupational therapy.   2. Chronic permanent atrial fibrillation/ NEW RVR.Continue uncontrolled atrial fibrillation, will increase metoprolol to 75 mg po bid and continue close telemetry monitoring Patient with no signs of heart failure. Continue anticoagulation with apixaban.   3. Urinary tract infection. treatment completed.   4. Chronic pulmonary fibrosis. Echocardiogram from 06/21 withincreased RV systolic pressure and mild reduction in RV systolic function, consistent with pulmonary hypertension.  5. HTN.stable blood pressure off antihypertensive medications.   6. AKI on CKD stage 3a. patient with poor oral intake.  Follow with nutrition recommendations. Avoid nephrotoxic medications or hypotension.      Status is: Inpatient  Remains inpatient appropriate because:Inpatient level of care appropriate due to severity of illness   Dispo: The patient is from: Home              Anticipated d/c is to: SNF              Anticipated d/c date is: 1 day              Patient currently is not medically stable to d/c. Need better  heart rate control before  discharge.    DVT prophylaxis: Enoxaparin   Code Status:   full  Family Communication:  I spoke with patient's daughter at the bedside, we talked in detail about patient's condition, plan of care and prognosis and all questions were addressed.      Nutrition Status: Nutrition Problem: Increased nutrient needs Etiology: post-op healing, hip fracture Signs/Symptoms: estimated needs Interventions: Refer to RD note for recommendations    Consultants:   Orthopedics   Procedures:  Treatment of intertrochanteric, pertrochanteric, subtrochanteric fracture with intramedullary implant. CPT [phone]      Subjective: Patient denies any chest pain or dyspnea, had positive discomfort when moving per her daughter's report, information is limited due to patient's cognitive impairment. Her HR has been elevated up to 160  Objective: Vitals:   01/30/20 0448 01/30/20 0621 01/30/20 0806  BP: 120/70 109/70 102/82  Pulse: (!) 110 98 92  Resp: 17 15 17   Temp: 97.8 F (36.6 C) 98.2 F (36.8 C) 98.2 F (36.8 C)  TempSrc: Oral Oral Oral  SpO2: 98% 90% 96%    Intake/Output Summary (Last 24 hours) at 01/30/2020 1226 Last data filed at 01/30/2020 0800 Gross per 24 hour  Intake 360 ml  Output 500 ml  Net -140 ml   There were no vitals filed for this visit.  Examination:   General: Not in pain or dyspnea, deconditioned  Neurology: Awake and alert, non focal  E ENT: no pallor, no icterus, oral mucosa moist Cardiovascular: No JVD. S1-S2 present, tachycardic irregularly irregular with no gallops, rubs, or murmurs. No lower extremity edema. Pulmonary: positive breath sounds bilaterally, adequate air movement, no wheezing, rhonchi or rales. Gastrointestinal. Abdomen soft and non tender Skin. No rashes Musculoskeletal: no joint deformities     Data Reviewed: I have personally reviewed following labs and imaging studies  CBC: Recent Labs  Lab 01/24/20 1836 01/26/20 0903 01/27/20 0313  01/28/20 0404 01/29/20 0235  WBC 9.4 16.9* 14.1* 14.4* 13.2*  NEUTROABS  --  14.4* 12.6*  --   --   HGB 12.8 11.7* 10.7* 9.5* 9.7*  HCT 38.5 35.3* 32.1* 29.1* 29.8*  MCV 101.6* 99.2 99.7 100.7* 99.7  PLT 217 186 174 169 175   Basic Metabolic Panel: Recent Labs  Lab 01/24/20 1950 01/26/20 0903 01/27/20 0313 01/28/20 0404 01/29/20 0235  NA 137 137 137 134* 134*  K 4.5 4.1 4.6 4.4 4.4  CL 111 101 103 103 103  CO2 18* 26 27 26 25   GLUCOSE 97 127* 135* 103* 114*  BUN 31* 30* 34* 40* 31*  CREATININE 0.79 1.01* 1.11* 1.31* 1.17*  CALCIUM 6.5* 8.8* 8.6* 8.1* 8.2*   GFR: CrCl cannot be calculated (Unknown ideal weight.). Liver Function Tests: Recent Labs  Lab 01/24/20 1950 01/29/20 0235  AST 35 16  ALT 8 11  ALKPHOS 24* 30*  BILITOT 1.1 0.7  PROT 4.2* 4.9*  ALBUMIN 2.1* 2.2*   No results for input(s): LIPASE, AMYLASE in the last 168 hours. No results for input(s): AMMONIA in the last 168 hours. Coagulation Profile: Recent Labs  Lab 01/24/20 1836  INR 1.1   Cardiac Enzymes: No results for input(s): CKTOTAL, CKMB, CKMBINDEX, TROPONINI in the last 168 hours. BNP (last 3 results) No results for input(s): PROBNP in the last 8760 hours. HbA1C: No results for input(s): HGBA1C in the last 72 hours. CBG: No results for input(s): GLUCAP in the last 168 hours. Lipid Profile: No results for input(s): CHOL, HDL, LDLCALC, TRIG, CHOLHDL, LDLDIRECT  in the last 72 hours. Thyroid Function Tests: No results for input(s): TSH, T4TOTAL, FREET4, T3FREE, THYROIDAB in the last 72 hours. Anemia Panel: Recent Labs    01/28/20 1730  VITAMINB12 517  FOLATE 21.0  FERRITIN 223      Radiology Studies: I have reviewed all of the imaging during this hospital visit personally     Scheduled Meds: . acetaminophen  650 mg Oral Q6H  . apixaban  2.5 mg Oral BID  . metoprolol tartrate  75 mg Oral BID  . senna-docusate  1 tablet Oral BID   Continuous Infusions:   LOS: 6 days         [name] Bransfield Annett Gula, MD

## 2020-01-30 NOTE — Progress Notes (Signed)
Physical Therapy Treatment Patient Details Name: [name] MRN: [id number] DOB: 17-Aug-1932 Today's Date: 01/30/2020    History of Present Illness [name] is a 86 y.o. female with medical history significant of CKD 3, HTN, vascular dementia, A.Fib on eliquis, bilateral CEAs. Pt with recent diagnosis of UTI. Pt s/p fall and sustained L hip fracture. Pt s/p ORIF on 9/4 - L LE WBAT.    PT Comments    Pt supine in bed on arrival.  She continue to be confused due to baseline dementia but easily redirected.  Able to progress gt but continues to require max assistance.  SNF remains appropriate to continue rehab at d/c.     Follow Up Recommendations  SNF;Supervision/Assistance - 24 hour     Equipment Recommendations  None recommended by PT (TBD at next venue)    Recommendations for Other Services       Precautions / Restrictions Precautions Precautions: Fall Precaution Comments: pt very confused Restrictions Weight Bearing Restrictions: Yes LLE Weight Bearing: Weight bearing as tolerated    Mobility  Bed Mobility Overal bed mobility: Needs Assistance Bed Mobility: Supine to Sit     Supine to sit: Max assist     General bed mobility comments: Max assistance to advance LEs and elevate trunk into a seated position.  Pt required assistance to scoot hips to edge of bed.  Transfers Overall transfer level: Needs assistance Equipment used: Rolling walker (2 wheeled) Transfers: Sit to/from Stand Sit to Stand: Mod assist;From elevated surface         General transfer comment: Cues for hand placement with noted posterior bias.  Pt required cues for hip, trunk and head and extension.  Ambulation/Gait Ambulation/Gait assistance: Max assist;+2 safety/equipment Gait Distance (Feet): 12 Feet Assistive device: Rolling walker (2 wheeled) Gait Pattern/deviations: Step-to pattern;Trunk flexed;Antalgic;Leaning posteriorly     General Gait Details: Cues for sequencing and  trunk/hip extension.  Pt continues to lean posteriorly and required physical management of RW to advance forward.  Close chair follow for safety.   Stairs             Wheelchair Mobility    Modified Rankin (Stroke Patients Only)       Balance Overall balance assessment: Needs assistance Sitting-balance support: Feet supported;Single extremity supported Sitting balance-Leahy Scale: Poor Sitting balance - Comments: pt with significant R lateral lean due to inability to withstand pressure/wbing on L hip, pt initially required maxA to maintain EOB balance and then transitioned to min guard Postural control: Posterior lean Standing balance support: Bilateral upper extremity supported;During functional activity Standing balance-Leahy Scale: Zero Standing balance comment: dependent on physical assist                            Cognition Arousal/Alertness: Awake/alert Behavior During Therapy: WFL for tasks assessed/performed Overall Cognitive Status: History of cognitive impairments - at baseline                                 General Comments: Baseline dementia, perseverates on wanting to show the scar from her R knee replacement.      Exercises      General Comments        Pertinent Vitals/Pain Pain Assessment: Faces Faces Pain Scale: Hurts even more Pain Location: L hip and knee Pain Descriptors / Indicators: Grimacing;Guarding Pain Intervention(s): Monitored during session;Repositioned    Home Living  Prior Function            PT Goals (current goals can now be found in the care plan section) Acute Rehab PT Goals PT Goal Formulation: With patient/family Potential to Achieve Goals: Good Progress towards PT goals: Progressing toward goals    Frequency    Min 3X/week      PT Plan Current plan remains appropriate    Co-evaluation              AM-PAC PT "6 Clicks" Mobility   Outcome  Measure  Help needed turning from your back to your side while in a flat bed without using bedrails?: Total Help needed moving from lying on your back to sitting on the side of a flat bed without using bedrails?: Total Help needed moving to and from a bed to a chair (including a wheelchair)?: Total Help needed standing up from a chair using your arms (e.g., wheelchair or bedside chair)?: A Lot Help needed to walk in hospital room?: Total Help needed climbing 3-5 steps with a railing? : Total 6 Click Score: 7    End of Session Equipment Utilized During Treatment: Gait belt Activity Tolerance: Patient tolerated treatment well;Patient limited by pain Patient left: in chair;with call bell/phone within reach;with chair alarm set;with family/visitor present Nurse Communication: Mobility status PT Visit Diagnosis: Unsteadiness on feet (R26.81)     Time: 4132-4401 PT Time Calculation (min) (ACUTE ONLY): 17 min  Charges:  $Gait Training: 8-22 mins                     Bonney Leitz , PTA Acute Rehabilitation Services Pager [phone] Office [phone]    Jakell Trusty Artis Delay 01/30/2020, 1:41 PM

## 2020-01-30 NOTE — Care Management Important Message (Signed)
Important Message  Patient Details  Name: [name] MRN: [id number] Date of Birth: May 04, 1933   Medicare Important Message Given:  Yes - Important Message mailed due to current National Emergency  Verbal consent obtained due to current National Emergency  Relationship to patient: Self Contact Name: Neita Landrigan Call Date: 01/30/20  Time: 1157 Phone: [phone] Outcome: No Answer/Busy Important Message mailed to: Patient address on file    Orson Aloe 01/30/2020, 11:57 AM

## 2020-01-31 ENCOUNTER — Other Ambulatory Visit: Payer: Self-pay

## 2020-01-31 MED ORDER — CARVEDILOL 25 MG PO TABS
25.0000 mg | ORAL_TABLET | Freq: Two times a day (BID) | ORAL | Status: DC
  Administered 2020-01-31 – 2020-02-01 (×3): 25 mg via ORAL
  Filled 2020-01-31 (×3): qty 1

## 2020-01-31 NOTE — Plan of Care (Signed)
  Problem: Activity: Goal: Risk for activity intolerance will decrease Outcome: Progressing   Problem: Coping: Goal: Level of anxiety will decrease Outcome: Progressing   Problem: Pain Managment: Goal: General experience of comfort will improve Outcome: Progressing   Problem: Safety: Goal: Ability to remain free from injury will improve Outcome: Progressing   Problem: Skin Integrity: Goal: Risk for impaired skin integrity will decrease Outcome: Progressing   

## 2020-01-31 NOTE — Progress Notes (Signed)
PROGRESS NOTE    [name]  QPY:[id number] DOB: 1932/12/01 DOA: 01/24/2020 PCP: Delorse Lek, MD    Brief Narrative:  Patient admitted to the hospital with a working diagnosis of left intertrochanteric fracture.Complicated with atrial fibrillation with rapid ventricular response.  86 year old female past medical history for hypertension, chronic kidney disease stage IIIa, vascular dementia, atrial fibrillation, pulmonary fibrosisand history of carotid artery disease. Patient reported urinary incontinence for about 2 weeks, as an outpatient she was diagnosed with urinarytract infection, she was placed on Bactrim. She sustained a mechanical fall from her own height while walking. Post trauma she had severe left hip pain, left lower extremity deformity, andshe was unable to bear weight or ambulate. On her initial physical examination blood pressure 140/84, heart rate 91, respiratory rate 18, temperature 97.4, oxygen saturation 94%. Her lungswereclear to auscultation bilaterally, heart S1-S2, present rhythmic, soft abdomen, no lower extremity edema, she had left hip tenderness.  Sodium 137, potassium 4.5, chloride 111, bicarb 18, glucose 97, BUN 31, creatinine 0.79, white count 9.4, hemoglobin 12.8, hematocrit 38.5, platelets 217. SARS COVID-19 negative. Urinalysis 11-20 white cells, 6-10 red cells, specific gravity 1.015, positive nitrates. Head CT no acute changes.  Pelvic x-ray with mildly displaced, comminuted and slightly valgus angulated intertrochanteric left femur fracture. Chest radiograph with bilateral increased lung markings/fibrosis. EKG 97 bpm, left axis deviation, left anterior fascicular block, normal QTC, atrial fibrillation rhythm, no ST segment or T wave changes  Patient placed on analgesics for pain control and orthopedics was consulted. She developed atrial fibrillation with rapid ventricular response that required IV diltiazem infusion for rate control.  Metoprolol dose has been increased, holding on apixaban for orthopedic procedure.  Patient's mobility is impaired due to ambulatory dysfunction, at baseline with no angina. Patient has moderate cardiovascular risk for orthopedic procedure.   Had episodic RVR related to atrial fibrillation. Metoprolol dose has been increased with persistent episodic rapid ventricular response. Patient placed on carvedilol.    Assessment & Plan:   Principal Problem:   Closed left hip fracture, initial encounter [facility]) Active Problems:   Permanent atrial fibrillation (HCC)   Acute lower UTI   HTN (hypertension)   Pulmonary fibrosis (HCC)    1. Acute intertrochanteric left femur fracture.Continue pain control with acetaminophen, and PRN hydrocodone. PRN methocarbamol for muscle relaxant.   Plan to transfer to SNF when medically stable.   2. Chronic permanent atrial fibrillation/ NEW RVR.Persistent uncontrolled atrial fibrillation, telemetry personally reviewed, during the day heart rate about 100, but during the night up to 150.   Will change metoprolol to carvedilol 25 mg po bid and will continue telemetry monitoring, continue anticoagulation with apixaban, If persistent uncontrolled RVR will consult cardiology for assistance.    3. Urinary tract infection.treatment completed.   4. Chronic pulmonary fibrosis. Echocardiogram from 06/21 withincreased RV systolic pressure and mild reduction in RV systolic function, consistent with pulmonary hypertension. Oxygenation is 98% on room air.   5. HTN.blood pressure stable, now changed to carvedilol for better heart rate control.   6.AKI onCKD stage 3a. Renal function has been stable. Will check renal panel in am.     Patient continue to be at high risk for worsening atrial fibrillation   Status is: Inpatient  Remains inpatient appropriate because:Inpatient level of care appropriate due to severity of illness   Dispo: The patient  is from: Home              Anticipated d/c is to: SNF  Anticipated d/c date is: 2 days              Patient currently is not medically stable to d/c. continue with uncontrolled atrial fibrillation    DVT prophylaxis: apixaban   Code Status:   full  Family Communication:  No family at the bedside      Nutrition Status: Nutrition Problem: Increased nutrient needs Etiology: post-op healing, hip fracture Signs/Symptoms: estimated needs Interventions: Refer to RD note for recommendations    Consultants:  Orthopedics  Procedures: Treatment of intertrochanteric, pertrochanteric, subtrochanteric fracture with intramedullary implant. CPT [id number]      Subjective: Patient at the time of my examination with rapid heart rate, not feeling well, positive dyspnea and palpitations. No nausea or vomiting, no chest pain.   Objective: Vitals:   01/30/20 2037 01/31/20 0426 01/31/20 0428 01/31/20 0730  BP: 132/88  (!) 149/75 (!) 145/78  Pulse: (!) 104 99 97 95  Resp: 18 18 18 17   Temp: 97.8 F (36.6 C) 98.4 F (36.9 C) 97.7 F (36.5 C) 98.3 F (36.8 C)  TempSrc: Oral Oral Oral Oral  SpO2: 97% 98% 98% 99%    Intake/Output Summary (Last 24 hours) at 01/31/2020 04/01/2020 Last data filed at 01/31/2020 0900 Gross per 24 hour  Intake 360 ml  Output 250 ml  Net 110 ml   There were no vitals filed for this visit.  Examination:   General: Not in pain or dyspnea, deconditioned  Neurology: Awake and alert, non focal  E ENT: mild pallor, no icterus, oral mucosa moist Cardiovascular: No JVD. S1-S2 present, rhythmic, no gallops, rubs, or murmurs. No lower extremity edema. Pulmonary: positive  breath sounds bilaterally, adequate air movement, no wheezing, rhonchi or rales. Gastrointestinal. Soft and non tender Skin. No rashes Musculoskeletal: no joint deformities     Data Reviewed: I have personally reviewed following labs and imaging studies  CBC: Recent Labs  Lab  01/24/20 1836 01/26/20 0903 01/27/20 0313 01/28/20 0404 01/29/20 0235  WBC 9.4 16.9* 14.1* 14.4* 13.2*  NEUTROABS  --  14.4* 12.6*  --   --   HGB 12.8 11.7* 10.7* 9.5* 9.7*  HCT 38.5 35.3* 32.1* 29.1* 29.8*  MCV 101.6* 99.2 99.7 100.7* 99.7  PLT 217 186 174 169 175   Basic Metabolic Panel: Recent Labs  Lab 01/24/20 1950 01/26/20 0903 01/27/20 0313 01/28/20 0404 01/29/20 0235  NA 137 137 137 134* 134*  K 4.5 4.1 4.6 4.4 4.4  CL 111 101 103 103 103  CO2 18* 26 27 26 25   GLUCOSE 97 127* 135* 103* 114*  BUN 31* 30* 34* 40* 31*  CREATININE 0.79 1.01* 1.11* 1.31* 1.17*  CALCIUM 6.5* 8.8* 8.6* 8.1* 8.2*   GFR: CrCl cannot be calculated (Unknown ideal weight.). Liver Function Tests: Recent Labs  Lab 01/24/20 1950 01/29/20 0235  AST 35 16  ALT 8 11  ALKPHOS 24* 30*  BILITOT 1.1 0.7  PROT 4.2* 4.9*  ALBUMIN 2.1* 2.2*   No results for input(s): LIPASE, AMYLASE in the last 168 hours. No results for input(s): AMMONIA in the last 168 hours. Coagulation Profile: Recent Labs  Lab 01/24/20 1836  INR 1.1   Cardiac Enzymes: No results for input(s): CKTOTAL, CKMB, CKMBINDEX, TROPONINI in the last 168 hours. BNP (last 3 results) No results for input(s): PROBNP in the last 8760 hours. HbA1C: No results for input(s): HGBA1C in the last 72 hours. CBG: No results for input(s): GLUCAP in the last 168 hours. Lipid Profile: No results for  input(s): CHOL, HDL, LDLCALC, TRIG, CHOLHDL, LDLDIRECT in the last 72 hours. Thyroid Function Tests: No results for input(s): TSH, T4TOTAL, FREET4, T3FREE, THYROIDAB in the last 72 hours. Anemia Panel: Recent Labs    01/28/20 1730  VITAMINB12 517  FOLATE 21.0  FERRITIN 223      Radiology Studies: I have reviewed all of the imaging during this hospital visit personally     Scheduled Meds: . acetaminophen  650 mg Oral Q6H  . apixaban  2.5 mg Oral BID  . carvedilol  25 mg Oral BID WC  . feeding supplement (ENSURE ENLIVE)  237 mL  Oral BID BM  . metoprolol tartrate  75 mg Oral BID  . multivitamin with minerals  1 tablet Oral Daily  . senna-docusate  1 tablet Oral BID   Continuous Infusions:   LOS: 7 days        Gabriel Paulding Annett Gula, MD

## 2020-01-31 NOTE — Plan of Care (Signed)

## 2020-01-31 NOTE — TOC Progression Note (Signed)
Transition of Care [facility]) - Progression Note    Patient Details  Name: [name] MRN: [id number] Date of Birth: [birth year]  [facility]) CM/SW Contact  Epifanio Lesches, RN Phone Number: 01/31/2020, 1:35 PM  Clinical Narrative:    Bed offer with [facility] SNF extended and accepted by pt/family. Pt will transition to [facility] when medically ready.  TOC team will continue to monitor and follow....   Expected Discharge Plan: Skilled Nursing Facility [facility]) Barriers to Discharge: Continued Medical Work up  Expected Discharge Plan and Services Expected Discharge Plan: Skilled Nursing Facility [facility] SNF)   Discharge Planning Services: CM Consult                                           Social Determinants of Health (SDOH) Interventions    Readmission Risk Interventions No flowsheet data found.

## 2020-02-01 LAB — CBC WITH DIFFERENTIAL/PLATELET
Abs Immature Granulocytes: 0.11 10*3/uL — ABNORMAL HIGH (ref 0.00–0.07)
Basophils Absolute: 0 10*3/uL (ref 0.0–0.1)
Basophils Relative: 0 %
Eosinophils Absolute: 0.1 10*3/uL (ref 0.0–0.5)
Eosinophils Relative: 1 %
HCT: 34.5 % — ABNORMAL LOW (ref 36.0–46.0)
Hemoglobin: 11.6 g/dL — ABNORMAL LOW (ref 12.0–15.0)
Immature Granulocytes: 1 %
Lymphocytes Relative: 14 %
Lymphs Abs: 2 10*3/uL (ref 0.7–4.0)
MCH: 33.4 pg (ref 26.0–34.0)
MCHC: 33.6 g/dL (ref 30.0–36.0)
MCV: 99.4 fL (ref 80.0–100.0)
Monocytes Absolute: 0.7 10*3/uL (ref 0.1–1.0)
Monocytes Relative: 5 %
Neutro Abs: 11.7 10*3/uL — ABNORMAL HIGH (ref 1.7–7.7)
Neutrophils Relative %: 79 %
Platelets: 283 10*3/uL (ref 150–400)
RBC: 3.47 MIL/uL — ABNORMAL LOW (ref 3.87–5.11)
RDW: 12.3 % (ref 11.5–15.5)
WBC: 14.7 10*3/uL — ABNORMAL HIGH (ref 4.0–10.5)
nRBC: 0 % (ref 0.0–0.2)

## 2020-02-01 LAB — BASIC METABOLIC PANEL
Anion gap: 9 (ref 5–15)
BUN: 36 mg/dL — ABNORMAL HIGH (ref 8–23)
CO2: 28 mmol/L (ref 22–32)
Calcium: 8.8 mg/dL — ABNORMAL LOW (ref 8.9–10.3)
Chloride: 97 mmol/L — ABNORMAL LOW (ref 98–111)
Creatinine, Ser: 0.95 mg/dL (ref 0.44–1.00)
GFR calc Af Amer: >60 mL/min (ref >60)
GFR calc non Af Amer: 55 mL/min — ABNORMAL LOW (ref >60)
Glucose, Bld: 108 mg/dL — ABNORMAL HIGH (ref 70–99)
Potassium: 4.7 mmol/L (ref 3.5–5.1)
Sodium: 134 mmol/L — ABNORMAL LOW (ref 135–145)

## 2020-02-01 LAB — SARS CORONAVIRUS 2 BY RT PCR (HOSPITAL ORDER, PERFORMED IN ~~LOC~~ HOSPITAL LAB): SARS Coronavirus 2: NEGATIVE

## 2020-02-01 LAB — MAGNESIUM: Magnesium: 1.9 mg/dL (ref 1.7–2.4)

## 2020-02-01 MED ORDER — SENNOSIDES-DOCUSATE SODIUM 8.6-50 MG PO TABS: 1.0000 | ORAL_TABLET | Freq: Two times a day (BID) | ORAL | 0 refills | Status: AC

## 2020-02-01 MED ORDER — ADULT MULTIVITAMIN W/MINERALS CH: 1.0000 | ORAL_TABLET | Freq: Every day | ORAL | 0 refills | Status: AC

## 2020-02-01 MED ORDER — HYDROCODONE-ACETAMINOPHEN 5-325 MG PO TABS
1.0000 | ORAL_TABLET | Freq: Four times a day (QID) | ORAL | 0 refills | Status: AC | PRN
Start: 2020-02-01 — End: ?

## 2020-02-01 MED ORDER — CARVEDILOL 25 MG PO TABS: 25.0000 mg | ORAL_TABLET | Freq: Two times a day (BID) | ORAL | 0 refills | Status: AC

## 2020-02-01 NOTE — Progress Notes (Signed)
PT Cancellation Note  Patient Details Name: [name] MRN: [id number] DOB: 1933/05/01   Cancelled Treatment:    Reason Eval/Treat Not Completed: (P) Other (comment) (Pt to d/c to SNF, will defer PT needs to next level of care.)   Romello Hoehn Artis Delay 02/01/2020, 12:44 PM  Bonney Leitz , PTA Acute Rehabilitation Services Pager [phone] Office [phone]

## 2020-02-01 NOTE — TOC Transition Note (Signed)
Transition of Care [facility]) - CM/SW Discharge Note   Patient Details  Name: [name] MRN: [id number] Date of Birth: 1932-09-08  Transition of Care [facility]) CM/SW Contact:  Epifanio Lesches, RN Phone Number: [phone] 02/01/2020, 1:58 PM   Clinical Narrative:    Patient will DC to: [facility]. Anticipated DC date: 02/01/2020 Family notified: [facility] by: Sharin Mons   Per MD patient ready for DC today. RN, patient, patient's family, and facility notified of DC. Discharge Summary and FL2 sent to facility. RN to call report prior to discharge [phone]).Rm # [phone]. DC packet on chart. Ambulance transport requested for patient.   RNCM will sign off for now as intervention is no longer needed. Please consult [place] again if new needs arise.   Final next level of care: Skilled Nursing Facility Barriers to Discharge: No Barriers Identified   Patient Goals and CMS Choice Patient states their goals for this hospitalization and ongoing recovery are:: to get better CMS Medicare.gov Compare Post Acute Care list provided to:: Patient    Discharge Placement                       Discharge Plan and Services   Discharge Planning Services: CM Consult                                 Social Determinants of Health (SDOH) Interventions     Readmission Risk Interventions No flowsheet data found.

## 2020-02-01 NOTE — Discharge Summary (Signed)
Physician Discharge Summary  [name]:[id number] DOB: 01/20/1933 DOA: 01/24/2020  PCP: Delorse Lek, MD  Admit date: 01/24/2020 Discharge date: 02/01/2020  Admitted From: Home  Disposition:   SNF   Recommendations for Outpatient Follow-up and new medication changes:  1. Follow up with Dr. Doristine Counter in 7 days.  2. Metoprolol succinate has been changed to carvedilol for better blood pressure control. 3. Holding on losartan for now to prevent hypotension.   Home Health: na   Equipment/Devices: na    Discharge Condition: stable  CODE STATUS: full  Diet recommendation: heart healthy   Brief/Interim Summary: Patient admitted to the hospital with a working diagnosis of left intertrochanteric fracture.Complicated with atrial fibrillation with rapid ventricular response.  86 year old female past medical history for hypertension, chronic kidney disease stage IIIa, vascular dementia, atrial fibrillation, pulmonary fibrosisand history of carotid artery disease. Patient reported urinary incontinence for about 2 weeks, as an outpatient she was diagnosed with urinarytract infection, she was placed on Bactrim. She sustained a mechanical fall from her own height while walking. Post trauma she had severe left hip pain, left lower extremity deformity, andshe was unable to bear weight or ambulate. On her initial physical examination blood pressure 140/84, heart rate 91, respiratory rate 18, temperature 97.4, oxygen saturation 94%. Her lungswereclear to auscultation bilaterally, heart S1-S2, present rhythmic, soft abdomen, no lower extremity edema, she had left hip tenderness.  Sodium 137, potassium 4.5, chloride 111, bicarb 18, glucose 97, BUN 31, creatinine 0.79, white count 9.4, hemoglobin 12.8, hematocrit 38.5, platelets 217. SARS COVID-19 negative. Urinalysis 11-20 white cells, 6-10 red cells, specific gravity 1.015, positive nitrates. Head CT no acute changes.  Pelvic x-ray with  mildly displaced, comminuted and slightly valgus angulated intertrochanteric left femur fracture. Chest radiograph with bilateral increased lung markings/fibrosis. EKG 97 bpm, left axis deviation, left anterior fascicular block, normal QTC, atrial fibrillation rhythm, no ST segment or T wave changes  Patient placed on analgesics for pain control and orthopedics was consulted. She developed atrial fibrillation with rapid ventricular response that required IV diltiazem infusion for rate control. Metoprolol dose has been increased, and apixaban was hold for orthopedic procedure.  Patient's mobility was impaired due to ambulatory dysfunction, at baseline with no angina. Patient had moderate cardiovascular risk for orthopedic procedure.  Underwent left hip intramedullary implant with no major complications.   After procedure she had episodic RVR related to atrial fibrillation. AV blockade therapy was adjusted and achieved appropriate rate control with carvedilol 25 mg bid.   1.  Acute intertrochanteric left femur fracture.  Patient received analgesics, and DVT prophylaxis.  Underwent Ortho procedure with no major complications.  Patient was evaluated by physical therapy/Occupational Therapy, recommendations to continue recovery at the skilled nursing facility.  2.  Chronic/permanent atrial fibrillation with acute decompensation/rapid ventricular response.  Patient required intravenous infusion of diltiazem for heart rate control.  Eventually transition to oral beta-blockade.  She required multiple adjustments of her dose, finally rate control was achieved with carvedilol 25 mg twice daily.  No clinical signs of heart failure.  Anticoagulation has been resumed with apixaban.  3.  Urinary tract infection/reactive leukocytosis..  Patient completed therapy while hospitalized. Discharge white cell count 14.7, from a peak of 16.9.  4.  Chronic pulmonary fibrosis.  Echocardiography 06/21 showed a  preserved LV systolic function, RV systolic pressure was increased with mild reduction on RV systolic function.  Patient had no signs of decompensation, she had close oximetry monitoring.  5.  Hypertension.  Losartan  has been discontinued, her blood pressure has remained well controlled on beta-blockade.  6.  Acute kidney injury creatinine is a stage IIIa.  Patient received supportive medical therapy, with improvement of her kidney function. At discharge sodium 134, potassium 4.7, chloride 97, bicarb 28, glucose 108, BUN 36, creatinine 0.95.   Discharge Diagnoses:  Principal Problem:   Closed left hip fracture, initial encounter [facility]) Active Problems:   Permanent atrial fibrillation (HCC)   Acute lower UTI   HTN (hypertension)   Pulmonary fibrosis (HCC)    Discharge Instructions  Discharge Instructions    Diet - low sodium heart healthy   Complete by: As directed    Discharge instructions   Complete by: As directed    Please follow with primary care in one week.   Increase activity slowly   Complete by: As directed    No wound care   Complete by: As directed      Allergies as of 02/01/2020   No Known Allergies     Medication List    STOP taking these medications   losartan 50 MG tablet Commonly known as: COZAAR   metoprolol succinate 50 MG 24 hr tablet Commonly known as: TOPROL-XL   sulfamethoxazole-trimethoprim 800-160 MG tablet Commonly known as: BACTRIM DS     TAKE these medications   acetaminophen 650 MG CR tablet Commonly known as: TYLENOL Take 650 mg by mouth in the morning and at bedtime.   apixaban 2.5 MG Tabs tablet Commonly known as: ELIQUIS Take 1 tablet (2.5 mg total) by mouth 2 (two) times daily.   Caltrate 600+D3 Soft 600-800 MG-UNIT Chew Generic drug: Calcium Carb-Cholecalciferol Chew 1 tablet by mouth in the morning and at bedtime.   carvedilol 25 MG tablet Commonly known as: COREG Take 1 tablet (25 mg total) by mouth 2 (two) times  daily with a meal.   feeding supplement Liqd Take 1 Container by mouth in the morning.   HYDROcodone-acetaminophen 5-325 MG tablet Commonly known as: NORCO/VICODIN Take 1 tablet by mouth every 6 (six) hours as needed for severe pain.   multivitamin with minerals Tabs tablet Take 1 tablet by mouth daily. Start taking on: February 02, 2020   NON FORMULARY Take 1 tablet by mouth See admin instructions. Vitafusion Women's Gummy Vitamins- Chew 1 gummie by mouth 2 times a day- morning and night   NON FORMULARY Pair of compression stockings 15-59mm Hg   senna-docusate 8.6-50 MG tablet Commonly known as: Senokot-S Take 1 tablet by mouth 2 (two) times daily.       Contact information for follow-up providers    Yolonda Kida, MD In 2 weeks.   Specialty: Orthopedic Surgery Why: For suture removal, For wound re-check Contact information: [address] [place] [address] [phone]        Delorse Lek, MD Follow up in 1 week(s).   Specialty: Family Medicine Contact information: [address] [place] [address] [phone]            Contact information for after-discharge care    Destination    [facility] .   Service: Skilled Nursing Contact information: [address] [place] [address] [phone]                 No Known Allergies  Consultations:  Orthopedics    Procedures/Studies: CT Head Wo Contrast  Result Date: 01/24/2020 CLINICAL DATA:  Fall, head injury EXAM: CT HEAD WITHOUT CONTRAST TECHNIQUE: Contiguous axial  images were obtained from the base of the skull through the vertex without intravenous contrast. COMPARISON:  02/09/2017 FINDINGS: Brain: Normal anatomic configuration. Mild parenchymal volume loss is commensurate with the patient's age. Moderate periventricular white matter changes are present likely reflecting the sequela of small vessel ischemia, stable  since prior examination. No abnormal intra or extra-axial mass lesion or fluid collection. No abnormal mass effect or midline shift. No evidence of acute intracranial hemorrhage or infarct. Ventricular size is normal. Cerebellum unremarkable. Vascular: There is extensive atherosclerotic calcification involving the carotid siphons. No hyperdense asymmetric vasculature at the skull base. Skull: Intact Sinuses/Orbits: Paranasal sinuses are clear. Orbits are unremarkable. Other: Mastoid air cells and middle ear cavities are clear. z 9 mm nodule within the left temporal scalp is nonspecific, but appears stable since prior examination. IMPRESSION: 1. No evidence of acute intracranial abnormality. Electronically Signed   By: Helyn Numbers MD   On: 01/24/2020 19:56   DG Pelvis Portable  Result Date: 01/24/2020 CLINICAL DATA:  Fall, possible hip fracture. EXAM: PORTABLE PELVIS 1-2 VIEWS COMPARISON:  CT abdomen pelvis 10/10/2013 FINDINGS: Mildly displaced, comminuted and slightly valgus angulated intertrochanteric left femur fracture. Remaining bones of the pelvis and proximal right femur are intact and congruent. Femoral heads remain normally located. Background of mild degenerative changes in the lower lumbar spine, pelvis and hips. Asymmetric left hip and proximal thigh swelling. Vascular calcium noted in the soft tissues. IMPRESSION: 1. Mildly displaced, comminuted and slightly valgus angulated intertrochanteric left femur fracture. 2. Remaining bones of the pelvis and proximal right femur are intact. Electronically Signed   By: Kreg Shropshire M.D.   On: 01/24/2020 19:06   Chest Portable 1 View  Result Date: 01/24/2020 CLINICAL DATA:  Preop EXAM: PORTABLE CHEST 1 VIEW COMPARISON:  10/19/2019, CT 04/11/2012 FINDINGS: Diffuse bilateral interstitial and reticular opacity consistent with pulmonary fibrosis. No acute consolidation or effusion. Cardiomegaly with aortic atherosclerosis. No pneumothorax. IMPRESSION:  Cardiomegaly with pulmonary fibrosis.  No acute airspace disease. Electronically Signed   By: Jasmine Pang M.D.   On: 01/24/2020 22:03   DG C-Arm 1-60 Min  Result Date: 01/26/2020 CLINICAL DATA:  Portable operative imaging provided for left intertrochanteric proximal femur fracture ORIF. EXAM: LEFT FEMUR 2 VIEWS; DG C-ARM 1-60 MIN COMPARISON:  01/24/2020 FINDINGS: Five portable images show placement of an intramedullary rod supporting a compression screw, reducing the fracture components into near anatomic alignment. The orthopedic hardware is well-seated. IMPRESSION: Well aligned right proximal femur intertrochanteric fracture following ORIF. Electronically Signed   By: Amie Portland M.D.   On: 01/26/2020 14:37   DG Femur 1 View Left  Result Date: 01/24/2020 CLINICAL DATA:  Fall, left hip pain EXAM: LEFT FEMUR 1 VIEW COMPARISON:  None. FINDINGS: Single view radiograph left hip demonstrates a a probable impacted intertrochanteric left hip fracture. Left femoral head is seated within the left acetabulum and there is mild joint space narrowing in keeping with at least mild left hip degenerative arthritis. Visualized pelvis appears intact. Degenerative changes are seen within the left knee, not well profiled on this examination. Extensive vascular calcifications are noted. IMPRESSION: Suspected intratrochanteric left hip fracture. Dedicated two view radiograph or CT examination is recommended for further evaluation. Electronically Signed   By: Helyn Numbers MD   On: 01/24/2020 19:07   DG FEMUR MIN 2 VIEWS LEFT  Result Date: 01/26/2020 CLINICAL DATA:  Portable operative imaging provided for left intertrochanteric proximal femur fracture ORIF. EXAM: LEFT FEMUR 2 VIEWS; DG C-ARM 1-60 MIN COMPARISON:  01/24/2020 FINDINGS: Five portable images show placement of an intramedullary rod supporting a compression screw, reducing the fracture components into near anatomic alignment. The orthopedic hardware is  well-seated. IMPRESSION: Well aligned right proximal femur intertrochanteric fracture following ORIF. Electronically Signed   By: Amie Portlandavid  Ormond M.D.   On: 01/26/2020 14:37      Procedures: Treatment of intertrochanteric, pertrochanteric, subtrochanteric fracture with intramedullary implant.   Subjective: Patient is feeling well, no dyspnea, no chest pain or palpitations, no nausea or vomiting. Pain is well controlled.   Discharge Exam: Vitals:   02/01/20 0457 02/01/20 0732  BP: 118/77 114/62  Pulse: 100 85  Resp: 17 17  Temp: (!) 97.5 F (36.4 C) (!) 97.5 F (36.4 C)  SpO2: 96%    Vitals:   01/31/20 1322 01/31/20 2001 02/01/20 0457 02/01/20 0732  BP: 103/81 94/62 118/77 114/62  Pulse: 82 100 100 85  Resp: 17 17 17 17   Temp: 98 F (36.7 C) (!) 97.4 F (36.3 C) (!) 97.5 F (36.4 C) (!) 97.5 F (36.4 C)  TempSrc: Oral Oral Oral Oral  SpO2: 98% 98% 96%   Weight:      Height:        General: Not in pain or dyspnea.  Neurology: Awake and alert, non focal  E ENT: no pallor, no icterus, oral mucosa moist Cardiovascular: No JVD. S1-S2 present, irregularly irregular, no gallops, rubs, or murmurs. No lower extremity edema. Pulmonary: positive breath sounds bilaterally, adequate air movement, no wheezing, rhonchi or rales. Gastrointestinal. Abdomen soft and non tender Skin. No rashes Musculoskeletal: no joint deformities   The results of significant diagnostics from this hospitalization (including imaging, microbiology, ancillary and laboratory) are listed below for reference.     Microbiology: Recent Results (from the past 240 hour(s))  SARS Coronavirus 2 by RT PCR (hospital order, performed in [facility] hospital lab) Nasopharyngeal Nasopharyngeal Swab     Status: None   Collection Time: 01/24/20  6:55 PM   Specimen: Nasopharyngeal Swab  Result Value Ref Range Status   SARS Coronavirus 2 NEGATIVE NEGATIVE Final    Comment: (NOTE) SARS-CoV-2 target nucleic acids are  NOT DETECTED.  The SARS-CoV-2 RNA is generally detectable in upper and lower respiratory specimens during the acute phase of infection. The lowest concentration of SARS-CoV-2 viral copies this assay can detect is 250 copies / mL. A negative result does not preclude SARS-CoV-2 infection and should not be used as the sole basis for treatment or other patient management decisions.  A negative result may occur with improper specimen collection / handling, submission of specimen other than nasopharyngeal swab, presence of viral mutation(s) within the areas targeted by this assay, and inadequate number of viral copies (<250 copies / mL). A negative result must be combined with clinical observations, patient history, and epidemiological information.  Fact Sheet for Patients:   BoilerBrush.com.cyhttps://www.fda.gov/media/136312/download  Fact Sheet for Healthcare Providers: https://pope.com/https://www.fda.gov/media/136313/download  This test is not yet approved or  cleared by the [place] FDA and has been authorized for detection and/or diagnosis of SARS-CoV-2 by FDA under an Emergency Use Authorization (EUA).  This EUA will remain in effect (meaning this test can be used) for the duration of the COVID-19 declaration under Section 564(b)(1) of the Act, 21 U.S.C. section 360bbb-3(b)(1), unless the authorization is terminated or revoked sooner.  Performed at [facility], [address] [place], [place] [id number]   Culture, Urine     Status: Abnormal   Collection Time: 01/24/20 10:42 PM  Specimen: Urine, Random  Result Value Ref Range Status   Specimen Description URINE, RANDOM  Final   Special Requests   Final    NONE Performed at [facility], [address], [place], [place] [address]    Culture >=100,000 COLONIES/mL ESCHERICHIA COLI (A)  Final   Report Status 01/26/2020 FINAL  Final   Organism ID, Bacteria ESCHERICHIA COLI (A)  Final      Susceptibility   Escherichia coli - MIC*    AMPICILLIN  >=32 RESISTANT Resistant     CEFAZOLIN 8 SENSITIVE Sensitive     CEFTRIAXONE <=0.25 SENSITIVE Sensitive     CIPROFLOXACIN <=0.25 SENSITIVE Sensitive     GENTAMICIN <=1 SENSITIVE Sensitive     IMIPENEM <=0.25 SENSITIVE Sensitive     NITROFURANTOIN <=16 SENSITIVE Sensitive     TRIMETH/SULFA <=20 SENSITIVE Sensitive     AMPICILLIN/SULBACTAM >=32 RESISTANT Resistant     PIP/TAZO 64 INTERMEDIATE Intermediate     * >=100,000 COLONIES/mL ESCHERICHIA COLI  Surgical pcr screen     Status: None   Collection Time: 01/25/20 10:48 PM   Specimen: Nasal Mucosa; Nasal Swab  Result Value Ref Range Status   MRSA, PCR NEGATIVE NEGATIVE Final   Staphylococcus aureus NEGATIVE NEGATIVE Final    Comment: (NOTE) The Xpert SA Assay (FDA approved for NASAL specimens in patients 52 years of age and older), is one component of a comprehensive surveillance program. It is not intended to diagnose infection nor to guide or monitor treatment. Performed at [facility], [address] [place], [place] [address]      Labs: BNP (last 3 results) No results for input(s): BNP in the last 8760 hours. Basic Metabolic Panel: Recent Labs  Lab 01/26/20 0903 01/27/20 0313 01/28/20 0404 01/29/20 0235 02/01/20 0531  NA 137 137 134* 134* 134*  K 4.1 4.6 4.4 4.4 4.7  CL 101 103 103 103 97*  CO2 26 27 26 25 28   GLUCOSE 127* 135* 103* 114* 108*  BUN 30* 34* 40* 31* 36*  CREATININE 1.01* 1.11* 1.31* 1.17* 0.95  CALCIUM 8.8* 8.6* 8.1* 8.2* 8.8*  MG  --   --   --   --  1.9   Liver Function Tests: Recent Labs  Lab 01/29/20 0235  AST 16  ALT 11  ALKPHOS 30*  BILITOT 0.7  PROT 4.9*  ALBUMIN 2.2*   No results for input(s): LIPASE, AMYLASE in the last 168 hours. No results for input(s): AMMONIA in the last 168 hours. CBC: Recent Labs  Lab 01/26/20 0903 01/27/20 0313 01/28/20 0404 01/29/20 0235 02/01/20 0531  WBC 16.9* 14.1* 14.4* 13.2* 14.7*  NEUTROABS 14.4* 12.6*  --   --  11.7*  HGB 11.7* 10.7*  9.5* 9.7* 11.6*  HCT 35.3* 32.1* 29.1* 29.8* 34.5*  MCV 99.2 99.7 100.7* 99.7 99.4  PLT 186 174 169 175 283   Cardiac Enzymes: No results for input(s): CKTOTAL, CKMB, CKMBINDEX, TROPONINI in the last 168 hours. BNP: Invalid input(s): POCBNP CBG: No results for input(s): GLUCAP in the last 168 hours. D-Dimer No results for input(s): DDIMER in the last 72 hours. Hgb A1c No results for input(s): HGBA1C in the last 72 hours. Lipid Profile No results for input(s): CHOL, HDL, LDLCALC, TRIG, CHOLHDL, LDLDIRECT in the last 72 hours. Thyroid function studies No results for input(s): TSH, T4TOTAL, T3FREE, THYROIDAB in the last 72 hours.  Invalid input(s): FREET3 Anemia work up No results for input(s): VITAMINB12, FOLATE, FERRITIN, TIBC, IRON, RETICCTPCT in the  last 72 hours. Urinalysis    Component Value Date/Time   COLORURINE AMBER (A) 01/24/2020 2307   APPEARANCEUR CLOUDY (A) 01/24/2020 2307   LABSPEC 1.015 01/24/2020 2307   PHURINE 5.0 01/24/2020 2307   GLUCOSEU NEGATIVE 01/24/2020 2307   HGBUR LARGE (A) 01/24/2020 2307   BILIRUBINUR NEGATIVE 01/24/2020 2307   KETONESUR NEGATIVE 01/24/2020 2307   PROTEINUR 30 (A) 01/24/2020 2307   UROBILINOGEN 0.2 09/30/2010 0855   NITRITE POSITIVE (A) 01/24/2020 2307   LEUKOCYTESUR SMALL (A) 01/24/2020 2307   Sepsis Labs Invalid input(s): PROCALCITONIN,  WBC,  LACTICIDVEN Microbiology Recent Results (from the past 240 hour(s))  SARS Coronavirus 2 by RT PCR (hospital order, performed in [facility] hospital lab) Nasopharyngeal Nasopharyngeal Swab     Status: None   Collection Time: 01/24/20  6:55 PM   Specimen: Nasopharyngeal Swab  Result Value Ref Range Status   SARS Coronavirus 2 NEGATIVE NEGATIVE Final    Comment: (NOTE) SARS-CoV-2 target nucleic acids are NOT DETECTED.  The SARS-CoV-2 RNA is generally detectable in upper and lower respiratory specimens during the acute phase of infection. The lowest concentration of SARS-CoV-2 viral  copies this assay can detect is 250 copies / mL. A negative result does not preclude SARS-CoV-2 infection and should not be used as the sole basis for treatment or other patient management decisions.  A negative result may occur with improper specimen collection / handling, submission of specimen other than nasopharyngeal swab, presence of viral mutation(s) within the areas targeted by this assay, and inadequate number of viral copies (<250 copies / mL). A negative result must be combined with clinical observations, patient history, and epidemiological information.  Fact Sheet for Patients:   BoilerBrush.com.cy  Fact Sheet for Healthcare Providers: https://pope.com/  This test is not yet approved or  cleared by the [place] FDA and has been authorized for detection and/or diagnosis of SARS-CoV-2 by FDA under an Emergency Use Authorization (EUA).  This EUA will remain in effect (meaning this test can be used) for the duration of the COVID-19 declaration under Section 564(b)(1) of the Act, 21 U.S.C. section 360bbb-3(b)(1), unless the authorization is terminated or revoked sooner.  Performed at [facility], [address] [place], [place] [address]   Culture, Urine     Status: Abnormal   Collection Time: 01/24/20 10:42 PM   Specimen: Urine, Random  Result Value Ref Range Status   Specimen Description URINE, RANDOM  Final   Special Requests   Final    NONE Performed at [facility], [address] [place], [place] [address]    Culture >=100,000 COLONIES/mL ESCHERICHIA COLI (A)  Final   Report Status 01/26/2020 FINAL  Final   Organism ID, Bacteria ESCHERICHIA COLI (A)  Final      Susceptibility   Escherichia coli - MIC*    AMPICILLIN >=32 RESISTANT Resistant     CEFAZOLIN 8 SENSITIVE Sensitive     CEFTRIAXONE <=0.25 SENSITIVE Sensitive     CIPROFLOXACIN <=0.25 SENSITIVE Sensitive     GENTAMICIN <=1 SENSITIVE  Sensitive     IMIPENEM <=0.25 SENSITIVE Sensitive     NITROFURANTOIN <=16 SENSITIVE Sensitive     TRIMETH/SULFA <=20 SENSITIVE Sensitive     AMPICILLIN/SULBACTAM >=32 RESISTANT Resistant     PIP/TAZO 64 INTERMEDIATE Intermediate     * >=100,000 COLONIES/mL ESCHERICHIA COLI  Surgical pcr screen     Status: None   Collection Time: 01/25/20 10:48 PM   Specimen: Nasal Mucosa; Nasal Swab  Result  Value Ref Range Status   MRSA, PCR NEGATIVE NEGATIVE Final   Staphylococcus aureus NEGATIVE NEGATIVE Final    Comment: (NOTE) The Xpert SA Assay (FDA approved for NASAL specimens in patients 60 years of age and older), is one component of a comprehensive surveillance program. It is not intended to diagnose infection nor to guide or monitor treatment. Performed at [facility], [address] [place], [place] [address]      Time coordinating discharge: 45 minutes  SIGNED:   Coralie Keens, MD  [facility] 02/01/2020, 12:22 PM

## 2020-02-01 NOTE — Progress Notes (Signed)
Pt Iv removed, report called, belongings gathered, and [facility] arrived to take pt to [facility]. New covid is neg, all questions answered to satisfaction.

## 2020-02-11 ENCOUNTER — Other Ambulatory Visit (HOSPITAL_COMMUNITY): Payer: Self-pay | Admitting: *Deleted

## 2020-02-11 MED ORDER — APIXABAN 2.5 MG PO TABS: 2.5000 mg | ORAL_TABLET | Freq: Two times a day (BID) | ORAL | 3 refills | Status: AC

## 2020-06-24 DEATH — deceased

## 2021-06-15 IMAGING — DX DG CHEST 1V PORT
1 series · 1 of 1 positions shown · non-contrast
Comparison: 10/19/2019, CT 04/11/2012

CLINICAL DATA: Preop

EXAM:
PORTABLE CHEST 1 VIEW

[chest ap]
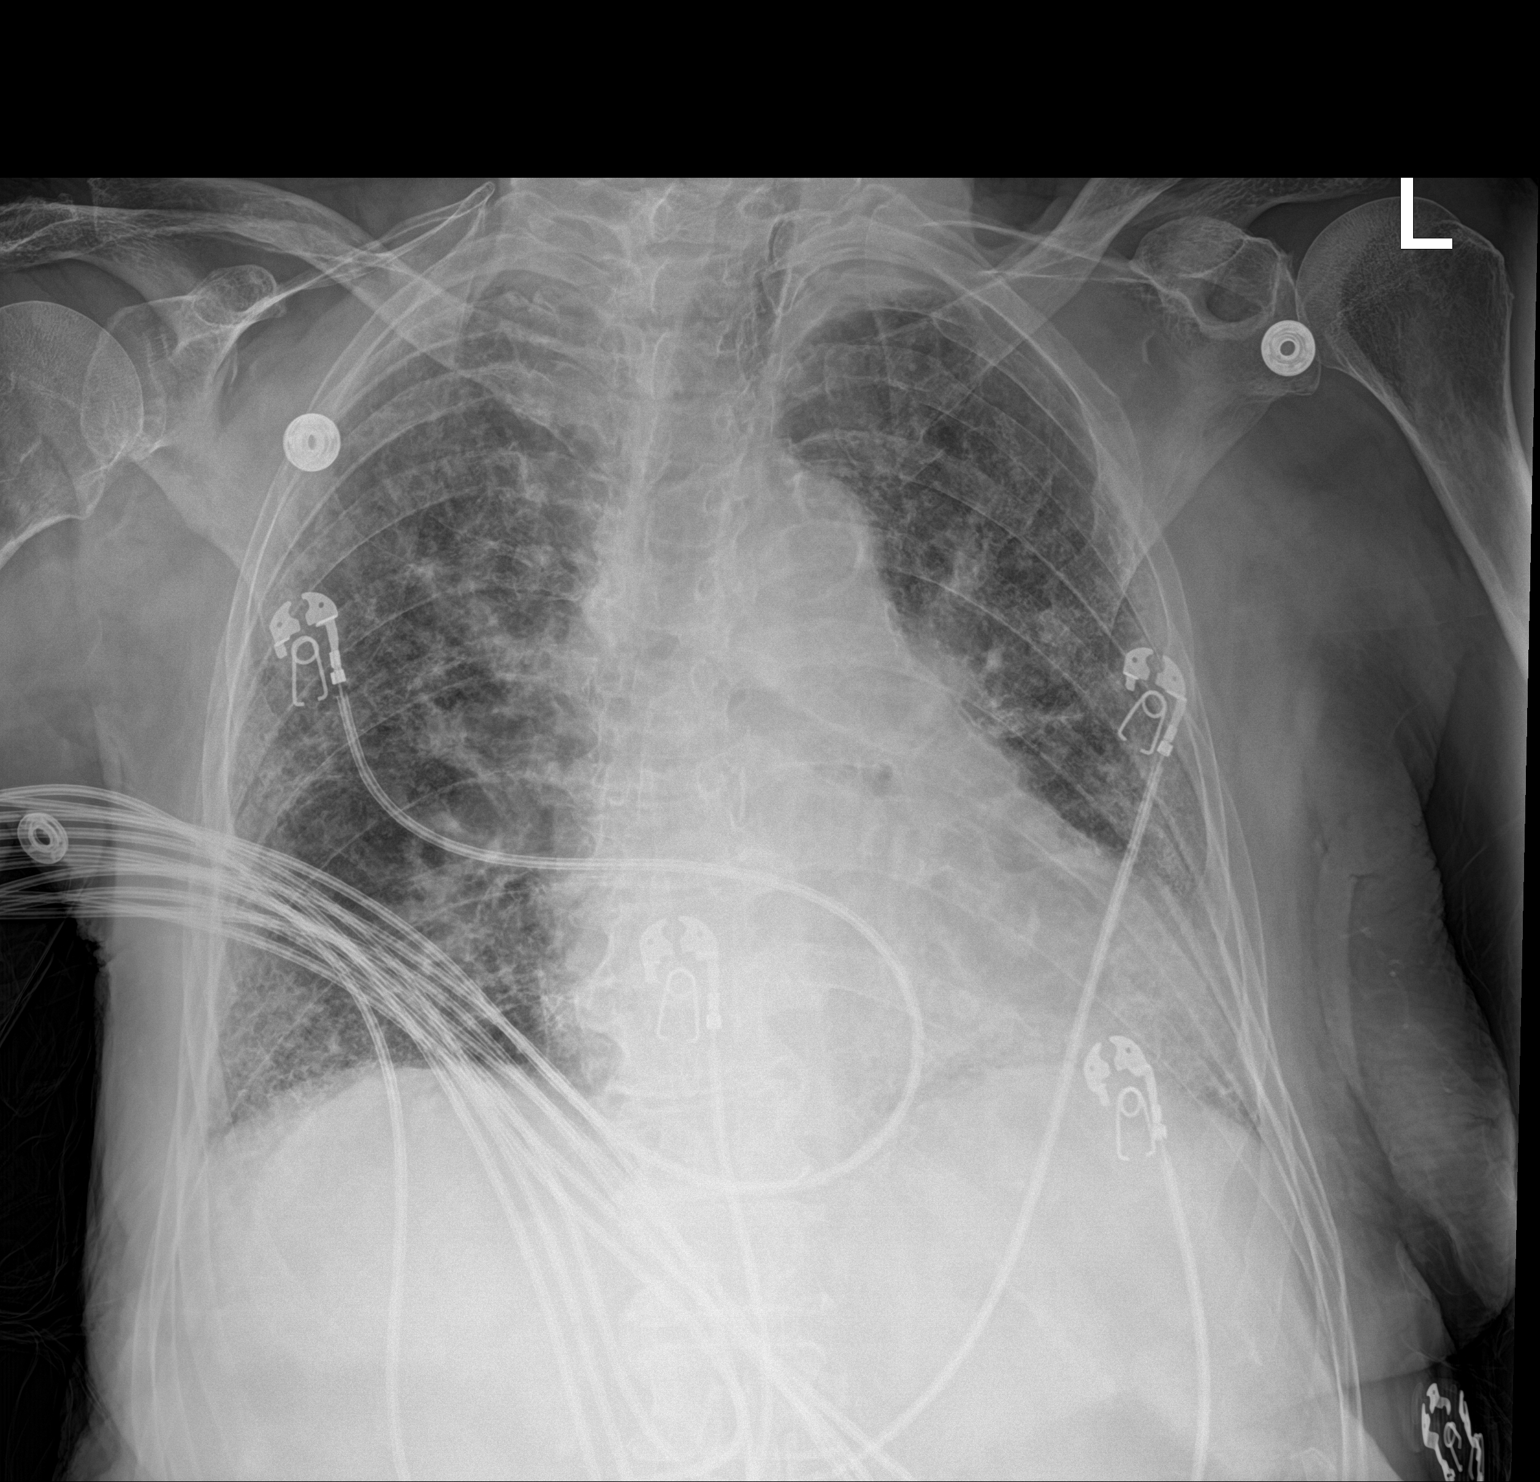

[1 of 1 positions shown; findings below may reference images not displayed]

FINDINGS: Diffuse bilateral interstitial and reticular opacity consistent with
pulmonary fibrosis. No acute consolidation or effusion. Cardiomegaly
with aortic atherosclerosis. No pneumothorax.
IMPRESSION: Cardiomegaly with pulmonary fibrosis.  No acute airspace disease.

## 2022-07-01 ENCOUNTER — Encounter (HOSPITAL_COMMUNITY): Payer: Self-pay | Admitting: *Deleted
